# Patient Record
Sex: Female | Born: 1935 | Race: White | Hispanic: No | Smoking: Never smoker
Health system: Southern US, Community
[De-identification: ages and names within clinical notes are randomized; demographics above are authoritative.]

## PROBLEM LIST (undated history)

## (undated) DIAGNOSIS — M810 Age-related osteoporosis without current pathological fracture: Secondary | ICD-10-CM

## (undated) DIAGNOSIS — I10 Essential (primary) hypertension: Secondary | ICD-10-CM

## (undated) DIAGNOSIS — F419 Anxiety disorder, unspecified: Secondary | ICD-10-CM

## (undated) DIAGNOSIS — I350 Nonrheumatic aortic (valve) stenosis: Secondary | ICD-10-CM

## (undated) DIAGNOSIS — I493 Ventricular premature depolarization: Secondary | ICD-10-CM

## (undated) DIAGNOSIS — I2699 Other pulmonary embolism without acute cor pulmonale: Secondary | ICD-10-CM

## (undated) DIAGNOSIS — M1712 Unilateral primary osteoarthritis, left knee: Secondary | ICD-10-CM

## (undated) DIAGNOSIS — Z8619 Personal history of other infectious and parasitic diseases: Secondary | ICD-10-CM

## (undated) DIAGNOSIS — M353 Polymyalgia rheumatica: Secondary | ICD-10-CM

## (undated) DIAGNOSIS — M7580 Other shoulder lesions, unspecified shoulder: Secondary | ICD-10-CM

## (undated) DIAGNOSIS — I6529 Occlusion and stenosis of unspecified carotid artery: Secondary | ICD-10-CM

## (undated) DIAGNOSIS — E871 Hypo-osmolality and hyponatremia: Secondary | ICD-10-CM

## (undated) HISTORY — PX: OTHER SURGICAL HISTORY: SHX169

## (undated) HISTORY — DX: Anxiety disorder, unspecified: F41.9

## (undated) HISTORY — DX: Polymyalgia rheumatica: M35.3

## (undated) HISTORY — DX: Unilateral primary osteoarthritis, left knee: M17.12

## (undated) HISTORY — DX: Essential (primary) hypertension: I10

## (undated) HISTORY — DX: Age-related osteoporosis without current pathological fracture: M81.0

## (undated) HISTORY — DX: Nonrheumatic aortic (valve) stenosis: I35.0

## (undated) HISTORY — DX: Occlusion and stenosis of unspecified carotid artery: I65.29

## (undated) HISTORY — DX: Personal history of other infectious and parasitic diseases: Z86.19

## (undated) HISTORY — DX: Hypo-osmolality and hyponatremia: E87.1

## (undated) HISTORY — DX: Ventricular premature depolarization: I49.3

## (undated) HISTORY — DX: Other pulmonary embolism without acute cor pulmonale: I26.99

## (undated) HISTORY — DX: Other shoulder lesions, unspecified shoulder: M75.80

## (undated) HISTORY — PX: SKIN CANCER EXCISION: SHX779

---

## 1998-08-03 ENCOUNTER — Ambulatory Visit (HOSPITAL_COMMUNITY): Admission: RE | Admit: 1998-08-03 | Discharge: 1998-08-03 | Payer: Self-pay | Admitting: Rheumatology

## 2000-07-24 ENCOUNTER — Encounter: Admission: RE | Admit: 2000-07-24 | Discharge: 2000-07-24 | Payer: Self-pay | Admitting: Rheumatology

## 2000-07-24 ENCOUNTER — Encounter: Payer: Self-pay | Admitting: Rheumatology

## 2001-03-18 ENCOUNTER — Other Ambulatory Visit: Admission: RE | Admit: 2001-03-18 | Discharge: 2001-03-18 | Payer: Self-pay | Admitting: Rheumatology

## 2001-11-18 ENCOUNTER — Encounter: Payer: Self-pay | Admitting: Rheumatology

## 2001-11-18 ENCOUNTER — Encounter: Admission: RE | Admit: 2001-11-18 | Discharge: 2001-11-18 | Payer: Self-pay | Admitting: Rheumatology

## 2002-07-14 ENCOUNTER — Encounter: Payer: Self-pay | Admitting: Rheumatology

## 2002-07-14 ENCOUNTER — Encounter: Admission: RE | Admit: 2002-07-14 | Discharge: 2002-07-14 | Payer: Self-pay | Admitting: Rheumatology

## 2003-12-18 ENCOUNTER — Encounter: Admission: RE | Admit: 2003-12-18 | Discharge: 2003-12-18 | Payer: Self-pay | Admitting: Rheumatology

## 2004-03-21 ENCOUNTER — Encounter: Admission: RE | Admit: 2004-03-21 | Discharge: 2004-03-21 | Payer: Self-pay | Admitting: Rheumatology

## 2004-11-10 ENCOUNTER — Encounter: Admission: RE | Admit: 2004-11-10 | Discharge: 2004-11-10 | Payer: Self-pay | Admitting: Rheumatology

## 2004-11-24 ENCOUNTER — Encounter: Admission: RE | Admit: 2004-11-24 | Discharge: 2004-11-24 | Payer: Self-pay | Admitting: Rheumatology

## 2004-12-01 ENCOUNTER — Encounter: Admission: RE | Admit: 2004-12-01 | Discharge: 2004-12-01 | Payer: Self-pay | Admitting: Rheumatology

## 2005-02-20 DIAGNOSIS — I2699 Other pulmonary embolism without acute cor pulmonale: Secondary | ICD-10-CM

## 2005-02-20 HISTORY — DX: Other pulmonary embolism without acute cor pulmonale: I26.99

## 2005-11-16 ENCOUNTER — Inpatient Hospital Stay (HOSPITAL_COMMUNITY): Admission: EM | Admit: 2005-11-16 | Discharge: 2005-11-23 | Payer: Self-pay | Admitting: Emergency Medicine

## 2005-11-16 ENCOUNTER — Encounter: Admission: RE | Admit: 2005-11-16 | Discharge: 2005-11-16 | Payer: Self-pay | Admitting: Rheumatology

## 2009-05-21 DIAGNOSIS — E871 Hypo-osmolality and hyponatremia: Secondary | ICD-10-CM

## 2009-05-21 HISTORY — DX: Hypo-osmolality and hyponatremia: E87.1

## 2010-02-20 DIAGNOSIS — Z8619 Personal history of other infectious and parasitic diseases: Secondary | ICD-10-CM

## 2010-02-20 HISTORY — DX: Personal history of other infectious and parasitic diseases: Z86.19

## 2011-06-05 ENCOUNTER — Other Ambulatory Visit: Payer: Self-pay | Admitting: Geriatric Medicine

## 2011-06-05 DIAGNOSIS — R42 Dizziness and giddiness: Secondary | ICD-10-CM

## 2011-06-12 ENCOUNTER — Ambulatory Visit
Admission: RE | Admit: 2011-06-12 | Discharge: 2011-06-12 | Disposition: A | Discharge status: Home / Self Care | Payer: Medicare Other | Source: Ambulatory Visit | Attending: Geriatric Medicine | Admitting: Geriatric Medicine

## 2011-06-12 DIAGNOSIS — R42 Dizziness and giddiness: Secondary | ICD-10-CM

## 2014-02-19 DIAGNOSIS — R413 Other amnesia: Secondary | ICD-10-CM | POA: Insufficient documentation

## 2014-02-19 DIAGNOSIS — I10 Essential (primary) hypertension: Secondary | ICD-10-CM | POA: Insufficient documentation

## 2014-02-19 DIAGNOSIS — I359 Nonrheumatic aortic valve disorder, unspecified: Secondary | ICD-10-CM | POA: Insufficient documentation

## 2014-02-19 DIAGNOSIS — R5383 Other fatigue: Secondary | ICD-10-CM | POA: Insufficient documentation

## 2015-01-05 ENCOUNTER — Inpatient Hospital Stay (HOSPITAL_COMMUNITY)
Admission: EM | Admit: 2015-01-05 | Discharge: 2015-01-09 | DRG: 291 | Disposition: A | Discharge status: Home Health | Payer: Medicare Other | Attending: Internal Medicine | Admitting: Internal Medicine

## 2015-01-05 ENCOUNTER — Encounter (HOSPITAL_COMMUNITY): Payer: Self-pay | Admitting: Emergency Medicine

## 2015-01-05 ENCOUNTER — Emergency Department (HOSPITAL_COMMUNITY): Payer: Medicare Other

## 2015-01-05 DIAGNOSIS — I5023 Acute on chronic systolic (congestive) heart failure: Secondary | ICD-10-CM | POA: Diagnosis present

## 2015-01-05 DIAGNOSIS — R0602 Shortness of breath: Secondary | ICD-10-CM

## 2015-01-05 DIAGNOSIS — I272 Other secondary pulmonary hypertension: Secondary | ICD-10-CM | POA: Diagnosis present

## 2015-01-05 DIAGNOSIS — R748 Abnormal levels of other serum enzymes: Secondary | ICD-10-CM | POA: Diagnosis present

## 2015-01-05 DIAGNOSIS — R778 Other specified abnormalities of plasma proteins: Secondary | ICD-10-CM

## 2015-01-05 DIAGNOSIS — E876 Hypokalemia: Secondary | ICD-10-CM | POA: Diagnosis not present

## 2015-01-05 DIAGNOSIS — Z66 Do not resuscitate: Secondary | ICD-10-CM | POA: Diagnosis present

## 2015-01-05 DIAGNOSIS — R079 Chest pain, unspecified: Secondary | ICD-10-CM | POA: Diagnosis not present

## 2015-01-05 DIAGNOSIS — E440 Moderate protein-calorie malnutrition: Secondary | ICD-10-CM | POA: Diagnosis present

## 2015-01-05 DIAGNOSIS — Z7982 Long term (current) use of aspirin: Secondary | ICD-10-CM

## 2015-01-05 DIAGNOSIS — Z86711 Personal history of pulmonary embolism: Secondary | ICD-10-CM

## 2015-01-05 DIAGNOSIS — I359 Nonrheumatic aortic valve disorder, unspecified: Secondary | ICD-10-CM | POA: Diagnosis present

## 2015-01-05 DIAGNOSIS — R7989 Other specified abnormal findings of blood chemistry: Secondary | ICD-10-CM

## 2015-01-05 DIAGNOSIS — I110 Hypertensive heart disease with heart failure: Principal | ICD-10-CM | POA: Diagnosis present

## 2015-01-05 DIAGNOSIS — I10 Essential (primary) hypertension: Secondary | ICD-10-CM | POA: Diagnosis present

## 2015-01-05 DIAGNOSIS — I350 Nonrheumatic aortic (valve) stenosis: Secondary | ICD-10-CM | POA: Diagnosis present

## 2015-01-05 DIAGNOSIS — M1712 Unilateral primary osteoarthritis, left knee: Secondary | ICD-10-CM | POA: Diagnosis present

## 2015-01-05 DIAGNOSIS — J9601 Acute respiratory failure with hypoxia: Secondary | ICD-10-CM | POA: Diagnosis present

## 2015-01-05 DIAGNOSIS — I509 Heart failure, unspecified: Secondary | ICD-10-CM | POA: Diagnosis present

## 2015-01-05 DIAGNOSIS — F419 Anxiety disorder, unspecified: Secondary | ICD-10-CM | POA: Diagnosis present

## 2015-01-05 DIAGNOSIS — M810 Age-related osteoporosis without current pathological fracture: Secondary | ICD-10-CM | POA: Diagnosis present

## 2015-01-05 DIAGNOSIS — I2489 Other forms of acute ischemic heart disease: Secondary | ICD-10-CM | POA: Diagnosis present

## 2015-01-05 DIAGNOSIS — R799 Abnormal finding of blood chemistry, unspecified: Secondary | ICD-10-CM

## 2015-01-05 DIAGNOSIS — D539 Nutritional anemia, unspecified: Secondary | ICD-10-CM | POA: Insufficient documentation

## 2015-01-05 DIAGNOSIS — M353 Polymyalgia rheumatica: Secondary | ICD-10-CM | POA: Diagnosis present

## 2015-01-05 DIAGNOSIS — I248 Other forms of acute ischemic heart disease: Secondary | ICD-10-CM | POA: Diagnosis present

## 2015-01-05 DIAGNOSIS — Z681 Body mass index (BMI) 19 or less, adult: Secondary | ICD-10-CM

## 2015-01-05 LAB — BASIC METABOLIC PANEL
ANION GAP: 7 (ref 5–15)
BUN: 31 mg/dL — ABNORMAL HIGH (ref 6–20)
CALCIUM: 9.7 mg/dL (ref 8.9–10.3)
CHLORIDE: 105 mmol/L (ref 101–111)
CO2: 29 mmol/L (ref 22–32)
Creatinine, Ser: 0.94 mg/dL (ref 0.44–1.00)
GFR calc non Af Amer: 52 mL/min — ABNORMAL LOW (ref >60)
GLUCOSE: 146 mg/dL — AB (ref 65–99)
POTASSIUM: 4.6 mmol/L (ref 3.5–5.1)
Sodium: 141 mmol/L (ref 135–145)

## 2015-01-05 LAB — CBC WITH DIFFERENTIAL/PLATELET
BASOS PCT: 0 %
Basophils Absolute: 0 10*3/uL (ref 0.0–0.1)
Eosinophils Absolute: 0.1 10*3/uL (ref 0.0–0.7)
Eosinophils Relative: 2 %
HEMATOCRIT: 33.1 % — AB (ref 36.0–46.0)
HEMOGLOBIN: 10.3 g/dL — AB (ref 12.0–15.0)
LYMPHS PCT: 31 %
Lymphs Abs: 2.3 10*3/uL (ref 0.7–4.0)
MCH: 32.2 pg (ref 26.0–34.0)
MCHC: 31.1 g/dL (ref 30.0–36.0)
MCV: 103.4 fL — AB (ref 78.0–100.0)
MONOS PCT: 7 %
Monocytes Absolute: 0.5 10*3/uL (ref 0.1–1.0)
NEUTROS ABS: 4.4 10*3/uL (ref 1.7–7.7)
NEUTROS PCT: 60 %
Platelets: 144 10*3/uL — ABNORMAL LOW (ref 150–400)
RBC: 3.2 MIL/uL — ABNORMAL LOW (ref 3.87–5.11)
RDW: 15.3 % (ref 11.5–15.5)
WBC: 7.4 10*3/uL (ref 4.0–10.5)

## 2015-01-05 MED ORDER — FUROSEMIDE 10 MG/ML IJ SOLN
40.0000 mg | Freq: Once | INTRAMUSCULAR | Status: AC
  Administered 2015-01-06: 40 mg via INTRAVENOUS
  Filled 2015-01-05: qty 4

## 2015-01-05 NOTE — ED Notes (Signed)
Glick, MD at bedside.  

## 2015-01-05 NOTE — ED Provider Notes (Signed)
CSN: [id number]   Arrival date & time 01/05/15 2250  History  By signing my name below, I, Bethel Born, attest that this documentation has been prepared under the direction and in the presence of Dione Booze, MD. Electronically Signed: Bethel Born, ED Scribe. 01/05/2015. 1:10 AM.  Chief Complaint  Patient presents with  . Shortness of Breath    HPI The history is provided by the patient and a relative. No language interpreter was used.   Natalie Arnold is a 81 y.o. female with history of PE in 2007, HTN, and aortic stenosis who presents to the Emergency Department complaining of constant and worsening SOB with gradual onset 2 days ago. Associated symptoms include dry cough, rib pain, and leg swelling. Pt denies orthopnea, sweating, and nausea. She ambulates with the assistance of a cane and states that she does not walk much. Her primary care is with Dr. Pete Glatter at [facility] and a relative at the bedside states that she had an unremarkable physical and lab work last month.  Past Medical History  Diagnosis Date  . Pulmonary embolus (HCC) 2007    status post Coumadin  . Hypertension   . Polymyalgia rheumatica (HCC)   . Osteoporosis   . Osteoarthritis of left knee   . Carotid artery occlusion     carotid bruits no ICA stenosis  . Anxiety   . PVC (premature ventricular contraction)   . Rotator cuff tendinitis   . Hyponatremia 05/2009    Mild  . Aortic stenosis, mild last echo 08/2009    pt declines further echoes 08/2010  . History of shingles 2012    Past Surgical History  Procedure Laterality Date  . Abdonimal hysterectomy    . Skin cancer excision      Family History  Problem Relation Age of Onset  . Emphysema Brother     Social History  Substance Use Topics  . Smoking status: Never Smoker   . Smokeless tobacco: None  . Alcohol Use: No     Review of Systems  Constitutional: Negative for fever and diaphoresis.  Respiratory: Positive for cough and shortness of  breath.   Cardiovascular: Positive for leg swelling.  Gastrointestinal: Negative for nausea.  Musculoskeletal:       Rib pain  All other systems reviewed and are negative.  Home Medications   Prior to Admission medications   Medication Sig Start Date End Date Taking? Authorizing Provider  amLODipine (NORVASC) 2.5 MG tablet Take 2.5 mg by mouth daily.   Yes Historical Provider, MD  Calcium Carbonate-Vitamin D (CALCIUM 600+D) 600-400 MG-UNIT per tablet Take 2 tablets by mouth daily.    Yes Historical Provider, MD  cholecalciferol (VITAMIN D) 1000 UNITS tablet Take 2,000 Units by mouth daily.    Yes Historical Provider, MD  HYDROcodone-acetaminophen (NORCO/VICODIN) 5-325 MG per tablet Take 1 tablet by mouth every 6 (six) hours as needed for moderate pain.   Yes Historical Provider, MD  LORazepam (ATIVAN) 1 MG tablet Take 0.5 mg by mouth 2 (two) times daily as needed for anxiety.    Yes Historical Provider, MD  magnesium hydroxide (MILK OF MAGNESIA) 400 MG/5ML suspension Take 30 mLs by mouth daily as needed for mild constipation.   Yes Historical Provider, MD  meclizine (ANTIVERT) 12.5 MG tablet Take 12.5 mg by mouth as needed for dizziness.   Yes Historical Provider, MD  Multiple Vitamin (MULTIVITAMIN) tablet Take 1 tablet by mouth daily.   Yes Historical Provider, MD  sennosides-docusate sodium (SENOKOT-S) 8.6-50 MG  tablet Take 1 tablet by mouth daily as needed for constipation.   Yes Historical Provider, MD    Allergies  Ultracet and Penicillins  Triage Vitals: BP 164/84 mmHg  Pulse 84  Temp(Src) 97.6 F (36.4 C) (Axillary)  Resp 26  SpO2 96%  Physical Exam  Constitutional: She is oriented to person, place, and time. She appears well-developed and well-nourished. No distress.  HENT:  Head: Normocephalic and atraumatic.  Eyes: EOM are normal. Pupils are equal, round, and reactive to light.  Neck: Normal range of motion. Neck supple. No JVD present.  Cardiovascular: Normal rate and  regular rhythm.   Murmur heard. 3/6 harsh Systolic ejection murmur at the base with radiation to the apex and to the neck consistent with aortic stenosis  Pulmonary/Chest: Effort normal. She has decreased breath sounds. She has rales.  Decreased breath sounds at the bases bilaterally. Bibasilar rales.  Abdominal: Soft. Bowel sounds are normal. She exhibits no distension and no mass. There is no tenderness.  Musculoskeletal: Normal range of motion.  1+ pitting edema bilaterally  Lymphadenopathy:    She has no cervical adenopathy.  Neurological: She is alert and oriented to person, place, and time. No cranial nerve deficit. She exhibits normal muscle tone. Coordination normal.  Skin: Skin is warm and dry. No rash noted.  Psychiatric: She has a normal mood and affect. Her behavior is normal. Judgment and thought content normal.  Nursing note and vitals reviewed.   ED Course  Procedures   DIAGNOSTIC STUDIES: Oxygen Saturation is 96% on RA, normal by my interpretation.    COORDINATION OF CARE: 11:44 PM Discussed treatment plan which includes lab work, CXR, EKG, and Lasix with pt at bedside and pt agreed to plan.  1:08 AM-Consult complete with Dr. Clyde LundborgNiu (Hospitalist). Patient case explained and discussed. Agrees to admit patient for further evaluation and treatment. Call ended at 1:09 AM   Results for orders placed or performed during the hospital encounter of 01/05/15  Basic metabolic panel  Result Value Ref Range   Sodium 141 135 - 145 mmol/L   Potassium 4.6 3.5 - 5.1 mmol/L   Chloride 105 101 - 111 mmol/L   CO2 29 22 - 32 mmol/L   Glucose, Bld 146 (H) 65 - 99 mg/dL   BUN 31 (H) 6 - 20 mg/dL   Creatinine, Ser 2.950.94 0.44 - 1.00 mg/dL   Calcium 9.7 8.9 - 62.110.3 mg/dL   GFR calc non Af Amer 52 (L) >60 mL/min   GFR calc Af Amer >60 >60 mL/min   Anion gap 7 5 - 15  CBC with Differential  Result Value Ref Range   WBC 7.4 4.0 - 10.5 K/uL   RBC 3.20 (L) 3.87 - 5.11 MIL/uL   Hemoglobin  10.3 (L) 12.0 - 15.0 g/dL   HCT 30.833.1 (L) 65.736.0 - 84.646.0 %   MCV 103.4 (H) 78.0 - 100.0 fL   MCH 32.2 26.0 - 34.0 pg   MCHC 31.1 30.0 - 36.0 g/dL   RDW 96.215.3 95.211.5 - 84.115.5 %   Platelets 144 (L) 150 - 400 K/uL   Neutrophils Relative % 60 %   Neutro Abs 4.4 1.7 - 7.7 K/uL   Lymphocytes Relative 31 %   Lymphs Abs 2.3 0.7 - 4.0 K/uL   Monocytes Relative 7 %   Monocytes Absolute 0.5 0.1 - 1.0 K/uL   Eosinophils Relative 2 %   Eosinophils Absolute 0.1 0.0 - 0.7 K/uL   Basophils Relative 0 %   Basophils  Absolute 0.0 0.0 - 0.1 K/uL  Brain natriuretic peptide  Result Value Ref Range   B Natriuretic Peptide 2380.1 (H) 0.0 - 100.0 pg/mL  Troponin I  Result Value Ref Range   Troponin I 0.21 (H) <0.031 ng/mL   Imaging Review Dg Chest 2 View  01/05/2015  CLINICAL DATA:  Pt states severe rt anteriot chest/rib pains x 2 weeks +,hx past PE, htn, non smoker, she states no known injury or fall EXAM: CHEST - 2 VIEW COMPARISON:  05/06/2012 FINDINGS: Interval development of moderate interstitial edema or infiltrates. Interval development of moderate bilateral pleural effusions, with adjacent consolidation/ atelectasis in both lung bases. Remote L2 kyphoplasty/vertebroplasty. Compression deformities of T11 and T12 as before. Atheromatous aorta. Mild cardiomegaly suspected, difficult to accurately assess due to adjacent opacities. IMPRESSION: 1. New bilateral interstitial edema, moderate effusions, and bibasilar atelectasis. Electronically Signed   By: Corlis Leak M.D.   On: 01/05/2015 23:32    I personally reviewed and evaluated these images and lab results as a part of my medical decision-making.   EKG Interpretation  Date/Time:  Tuesday January 05 2015 22:57:40 EST Ventricular Rate:  88 PR Interval:  190 QRS Duration: 76 QT Interval:  374 QTC Calculation: 452 R Axis:   21 Text Interpretation:  Normal sinus rhythm Anteroseptal infarct , age undetermined Abnormal ECG No old tracing to compare Confirmed by  [facility]  MD, Henley Blyth ([id number]) on 01/05/2015 11:42:07 PM    MDM   Final diagnoses:  Acute on chronic congestive heart failure, unspecified congestive heart failure type (HCC)  Elevated troponin I level  Elevated BUN  Macrocytic anemia    Dyspnea with peripheral edema suggestive of congestive heart failure. Chest x-ray shows bilateral pleural effusions which I'll also be consistent with congestive heart failure. Patient's exam is suggestive of aortic stenosis. Family member with her states that she had been offered procedure related to her murmur and had declined. Review of old records shows a hospitalization about 10 years ago for pulmonary embolism but she is no longer on anticoagulation. She is given a dose of furosemide. Laboratory workup shows a macrocytic anemia and significantly elevated BNP. She also has mild elevation of troponin. Unclear at this point whether this is demand ischemia or underlying cardiac injury. Mild elevation of the BUN is consistent with congestive heart failure. Of note, with aortic stenosis, development of angina or congestive heart failure would indicate a poor short-term (1-2 years) prognosis. Case is discussed with Dr. Clyde Lundborg [facility] who agrees to admit the patient.  I personally performed the services described in this documentation, which was scribed in my presence. The recorded information has been reviewed and is accurate.      Dione Booze, MD 01/06/15 0111

## 2015-01-05 NOTE — ED Notes (Signed)
Pt. reports worsening SOB with dry cough for several days , denies fever or chills , no chest pain .

## 2015-01-06 ENCOUNTER — Encounter (HOSPITAL_COMMUNITY): Payer: Self-pay

## 2015-01-06 ENCOUNTER — Inpatient Hospital Stay (HOSPITAL_COMMUNITY): Payer: Medicare Other

## 2015-01-06 ENCOUNTER — Other Ambulatory Visit (HOSPITAL_COMMUNITY): Payer: Medicare Other

## 2015-01-06 DIAGNOSIS — J9601 Acute respiratory failure with hypoxia: Secondary | ICD-10-CM | POA: Diagnosis present

## 2015-01-06 DIAGNOSIS — I509 Heart failure, unspecified: Secondary | ICD-10-CM

## 2015-01-06 DIAGNOSIS — I248 Other forms of acute ischemic heart disease: Secondary | ICD-10-CM | POA: Diagnosis present

## 2015-01-06 DIAGNOSIS — E440 Moderate protein-calorie malnutrition: Secondary | ICD-10-CM | POA: Diagnosis present

## 2015-01-06 DIAGNOSIS — F419 Anxiety disorder, unspecified: Secondary | ICD-10-CM | POA: Diagnosis present

## 2015-01-06 DIAGNOSIS — E876 Hypokalemia: Secondary | ICD-10-CM | POA: Diagnosis present

## 2015-01-06 DIAGNOSIS — R079 Chest pain, unspecified: Secondary | ICD-10-CM | POA: Diagnosis present

## 2015-01-06 DIAGNOSIS — Z7982 Long term (current) use of aspirin: Secondary | ICD-10-CM | POA: Diagnosis not present

## 2015-01-06 DIAGNOSIS — I350 Nonrheumatic aortic (valve) stenosis: Secondary | ICD-10-CM | POA: Diagnosis not present

## 2015-01-06 DIAGNOSIS — I359 Nonrheumatic aortic valve disorder, unspecified: Secondary | ICD-10-CM | POA: Diagnosis not present

## 2015-01-06 DIAGNOSIS — I110 Hypertensive heart disease with heart failure: Secondary | ICD-10-CM | POA: Diagnosis present

## 2015-01-06 DIAGNOSIS — M810 Age-related osteoporosis without current pathological fracture: Secondary | ICD-10-CM | POA: Diagnosis present

## 2015-01-06 DIAGNOSIS — I5031 Acute diastolic (congestive) heart failure: Secondary | ICD-10-CM | POA: Diagnosis not present

## 2015-01-06 DIAGNOSIS — R7989 Other specified abnormal findings of blood chemistry: Secondary | ICD-10-CM | POA: Diagnosis not present

## 2015-01-06 DIAGNOSIS — I272 Other secondary pulmonary hypertension: Secondary | ICD-10-CM | POA: Diagnosis present

## 2015-01-06 DIAGNOSIS — R748 Abnormal levels of other serum enzymes: Secondary | ICD-10-CM | POA: Diagnosis present

## 2015-01-06 DIAGNOSIS — Z86711 Personal history of pulmonary embolism: Secondary | ICD-10-CM | POA: Diagnosis not present

## 2015-01-06 DIAGNOSIS — I5023 Acute on chronic systolic (congestive) heart failure: Secondary | ICD-10-CM | POA: Diagnosis present

## 2015-01-06 DIAGNOSIS — Z681 Body mass index (BMI) 19 or less, adult: Secondary | ICD-10-CM | POA: Diagnosis not present

## 2015-01-06 DIAGNOSIS — M353 Polymyalgia rheumatica: Secondary | ICD-10-CM | POA: Diagnosis present

## 2015-01-06 DIAGNOSIS — I5033 Acute on chronic diastolic (congestive) heart failure: Secondary | ICD-10-CM | POA: Diagnosis not present

## 2015-01-06 DIAGNOSIS — M1712 Unilateral primary osteoarthritis, left knee: Secondary | ICD-10-CM | POA: Diagnosis present

## 2015-01-06 DIAGNOSIS — M179 Osteoarthritis of knee, unspecified: Secondary | ICD-10-CM

## 2015-01-06 DIAGNOSIS — Z66 Do not resuscitate: Secondary | ICD-10-CM | POA: Diagnosis present

## 2015-01-06 LAB — HEPARIN LEVEL (UNFRACTIONATED)
Heparin Unfractionated: 0.35 IU/mL (ref 0.30–0.70)
Heparin Unfractionated: 0.24 IU/mL — ABNORMAL LOW (ref 0.30–0.70)

## 2015-01-06 LAB — APTT: APTT: 28 s (ref 24–37)

## 2015-01-06 LAB — CBC
HCT: 32.7 % — ABNORMAL LOW (ref 36.0–46.0)
HEMOGLOBIN: 10.2 g/dL — AB (ref 12.0–15.0)
MCH: 32 pg (ref 26.0–34.0)
MCHC: 31.2 g/dL (ref 30.0–36.0)
MCV: 102.5 fL — ABNORMAL HIGH (ref 78.0–100.0)
Platelets: 133 10*3/uL — ABNORMAL LOW (ref 150–400)
RBC: 3.19 MIL/uL — ABNORMAL LOW (ref 3.87–5.11)
RDW: 15.1 % (ref 11.5–15.5)
WBC: 7.2 10*3/uL (ref 4.0–10.5)

## 2015-01-06 LAB — TROPONIN I
TROPONIN I: 0.21 ng/mL — AB (ref <0.031)
Troponin I: 0.21 ng/mL — ABNORMAL HIGH (ref <0.031)
Troponin I: 0.19 ng/mL — ABNORMAL HIGH (ref <0.031)
Troponin I: 0.19 ng/mL — ABNORMAL HIGH (ref <0.031)

## 2015-01-06 LAB — LIPID PANEL
CHOLESTEROL: 125 mg/dL (ref 0–200)
HDL: 43 mg/dL (ref >40)
LDL CALC: 62 mg/dL (ref 0–99)
Total CHOL/HDL Ratio: 2.9 RATIO
Triglycerides: 102 mg/dL (ref <150)
VLDL: 20 mg/dL (ref 0–40)

## 2015-01-06 LAB — COMPREHENSIVE METABOLIC PANEL
ALBUMIN: 4 g/dL (ref 3.5–5.0)
ALK PHOS: 65 U/L (ref 38–126)
ALT: 37 U/L (ref 14–54)
ANION GAP: 10 (ref 5–15)
AST: 58 U/L — ABNORMAL HIGH (ref 15–41)
BILIRUBIN TOTAL: 0.8 mg/dL (ref 0.3–1.2)
BUN: 31 mg/dL — ABNORMAL HIGH (ref 6–20)
CALCIUM: 9.9 mg/dL (ref 8.9–10.3)
CO2: 28 mmol/L (ref 22–32)
Chloride: 103 mmol/L (ref 101–111)
Creatinine, Ser: 1.01 mg/dL — ABNORMAL HIGH (ref 0.44–1.00)
GFR, EST AFRICAN AMERICAN: 55 mL/min — AB (ref >60)
GFR, EST NON AFRICAN AMERICAN: 48 mL/min — AB (ref >60)
GLUCOSE: 119 mg/dL — AB (ref 65–99)
Potassium: 4.4 mmol/L (ref 3.5–5.1)
Sodium: 141 mmol/L (ref 135–145)
TOTAL PROTEIN: 7.3 g/dL (ref 6.5–8.1)

## 2015-01-06 LAB — PROTIME-INR
INR: 1.17 (ref 0.00–1.49)
PROTHROMBIN TIME: 15 s (ref 11.6–15.2)

## 2015-01-06 LAB — TSH: TSH: 7.298 u[IU]/mL — AB (ref 0.350–4.500)

## 2015-01-06 LAB — GLUCOSE, CAPILLARY: GLUCOSE-CAPILLARY: 102 mg/dL — AB (ref 65–99)

## 2015-01-06 LAB — MRSA PCR SCREENING: MRSA by PCR: NEGATIVE

## 2015-01-06 LAB — BRAIN NATRIURETIC PEPTIDE: B NATRIURETIC PEPTIDE 5: 2380.1 pg/mL — AB (ref 0.0–100.0)

## 2015-01-06 MED ORDER — ALBUTEROL SULFATE (2.5 MG/3ML) 0.083% IN NEBU
2.5000 mg | INHALATION_SOLUTION | RESPIRATORY_TRACT | Status: DC | PRN
  Administered 2015-01-06: 2.5 mg via RESPIRATORY_TRACT

## 2015-01-06 MED ORDER — ASPIRIN EC 325 MG PO TBEC: 325.0000 mg | DELAYED_RELEASE_TABLET | Freq: Every day | ORAL | Status: DC

## 2015-01-06 MED ORDER — ONDANSETRON HCL 4 MG PO TABS: 4.0000 mg | ORAL_TABLET | Freq: Four times a day (QID) | ORAL | Status: DC | PRN

## 2015-01-06 MED ORDER — MECLIZINE HCL 12.5 MG PO TABS: 12.5000 mg | ORAL_TABLET | ORAL | Status: DC | PRN

## 2015-01-06 MED ORDER — ASPIRIN 81 MG PO CHEW: 324.0000 mg | CHEWABLE_TABLET | Freq: Once | ORAL | Status: DC

## 2015-01-06 MED ORDER — SENNOSIDES-DOCUSATE SODIUM 8.6-50 MG PO TABS: 1.0000 | ORAL_TABLET | Freq: Every day | ORAL | Status: DC | PRN

## 2015-01-06 MED ORDER — AMLODIPINE BESYLATE 2.5 MG PO TABS
2.5000 mg | ORAL_TABLET | Freq: Every day | ORAL | Status: DC
  Administered 2015-01-06 – 2015-01-09 (×4): 2.5 mg via ORAL
  Filled 2015-01-06 (×4): qty 1

## 2015-01-06 MED ORDER — ASPIRIN 81 MG PO CHEW
81.0000 mg | CHEWABLE_TABLET | Freq: Every day | ORAL | Status: DC
  Administered 2015-01-06 – 2015-01-09 (×4): 81 mg via ORAL
  Filled 2015-01-06 (×4): qty 1

## 2015-01-06 MED ORDER — FUROSEMIDE 10 MG/ML IJ SOLN: 40.0000 mg | Freq: Four times a day (QID) | INTRAMUSCULAR | Status: DC

## 2015-01-06 MED ORDER — MECLIZINE HCL 25 MG PO TABS: 12.5000 mg | ORAL_TABLET | Freq: Three times a day (TID) | ORAL | Status: DC | PRN

## 2015-01-06 MED ORDER — FUROSEMIDE 10 MG/ML IJ SOLN
40.0000 mg | Freq: Four times a day (QID) | INTRAMUSCULAR | Status: DC
  Administered 2015-01-06 – 2015-01-08 (×7): 40 mg via INTRAVENOUS
  Filled 2015-01-06 (×7): qty 4

## 2015-01-06 MED ORDER — SODIUM CHLORIDE 0.9 % IV SOLN
INTRAVENOUS | Status: DC
  Administered 2015-01-06: 03:00:00 via INTRAVENOUS

## 2015-01-06 MED ORDER — LISINOPRIL 2.5 MG PO TABS
2.5000 mg | ORAL_TABLET | Freq: Every day | ORAL | Status: DC
Start: 2015-01-06 — End: 2015-01-06

## 2015-01-06 MED ORDER — ONDANSETRON HCL 4 MG/2ML IJ SOLN: 4.0000 mg | Freq: Four times a day (QID) | INTRAMUSCULAR | Status: DC | PRN

## 2015-01-06 MED ORDER — HEPARIN (PORCINE) IN NACL 100-0.45 UNIT/ML-% IJ SOLN
700.0000 [IU]/h | INTRAMUSCULAR | Status: DC
  Administered 2015-01-06: 600 [IU]/h via INTRAVENOUS
  Administered 2015-01-07: 700 [IU]/h via INTRAVENOUS
  Filled 2015-01-06 (×2): qty 250

## 2015-01-06 MED ORDER — CETYLPYRIDINIUM CHLORIDE 0.05 % MT LIQD
7.0000 mL | Freq: Two times a day (BID) | OROMUCOSAL | Status: DC
  Administered 2015-01-06 – 2015-01-09 (×7): 7 mL via OROMUCOSAL

## 2015-01-06 MED ORDER — VITAMIN D 1000 UNITS PO TABS
2000.0000 [IU] | ORAL_TABLET | Freq: Every day | ORAL | Status: DC
  Administered 2015-01-06 – 2015-01-09 (×4): 2000 [IU] via ORAL
  Filled 2015-01-06 (×4): qty 2

## 2015-01-06 MED ORDER — MAGNESIUM HYDROXIDE 400 MG/5ML PO SUSP: 30.0000 mL | Freq: Every day | ORAL | Status: DC | PRN

## 2015-01-06 MED ORDER — CALCIUM CARBONATE-VITAMIN D 500-200 MG-UNIT PO TABS
2.0000 | ORAL_TABLET | Freq: Every day | ORAL | Status: DC
  Administered 2015-01-06 – 2015-01-09 (×4): 2 via ORAL
  Filled 2015-01-06 (×3): qty 2

## 2015-01-06 MED ORDER — OXYCODONE HCL 5 MG PO TABS: 5.0000 mg | ORAL_TABLET | ORAL | Status: DC | PRN

## 2015-01-06 MED ORDER — LISINOPRIL 5 MG PO TABS: 5.0000 mg | ORAL_TABLET | Freq: Every day | ORAL | Status: DC

## 2015-01-06 MED ORDER — ENSURE ENLIVE PO LIQD
237.0000 mL | Freq: Two times a day (BID) | ORAL | Status: DC
  Administered 2015-01-06 – 2015-01-09 (×5): 237 mL via ORAL

## 2015-01-06 MED ORDER — FUROSEMIDE 10 MG/ML IJ SOLN
40.0000 mg | Freq: Four times a day (QID) | INTRAMUSCULAR | Status: DC
  Administered 2015-01-06: 40 mg via INTRAVENOUS

## 2015-01-06 MED ORDER — ATORVASTATIN CALCIUM 80 MG PO TABS
80.0000 mg | ORAL_TABLET | Freq: Every day | ORAL | Status: DC
  Administered 2015-01-06 – 2015-01-08 (×3): 80 mg via ORAL
  Filled 2015-01-06 (×3): qty 1

## 2015-01-06 MED ORDER — IOHEXOL 350 MG/ML SOLN
80.0000 mL | Freq: Once | INTRAVENOUS | Status: AC | PRN
  Administered 2015-01-06: 80 mL via INTRAVENOUS

## 2015-01-06 MED ORDER — MORPHINE SULFATE (PF) 2 MG/ML IV SOLN: 2.0000 mg | INTRAVENOUS | Status: DC | PRN

## 2015-01-06 MED ORDER — LORAZEPAM 0.5 MG PO TABS
0.5000 mg | ORAL_TABLET | Freq: Two times a day (BID) | ORAL | Status: DC | PRN
  Administered 2015-01-08: 0.5 mg via ORAL
  Filled 2015-01-06: qty 1

## 2015-01-06 MED ORDER — ATORVASTATIN CALCIUM 40 MG PO TABS: 40.0000 mg | ORAL_TABLET | Freq: Every day | ORAL | Status: DC

## 2015-01-06 MED ORDER — SODIUM CHLORIDE 0.9 % IJ SOLN
3.0000 mL | Freq: Two times a day (BID) | INTRAMUSCULAR | Status: DC
  Administered 2015-01-06 – 2015-01-09 (×7): 3 mL via INTRAVENOUS

## 2015-01-06 MED ORDER — HYDROCODONE-ACETAMINOPHEN 5-325 MG PO TABS: 1.0000 | ORAL_TABLET | Freq: Four times a day (QID) | ORAL | Status: DC | PRN

## 2015-01-06 MED ORDER — NITROGLYCERIN 0.4 MG SL SUBL: 0.4000 mg | SUBLINGUAL_TABLET | SUBLINGUAL | Status: DC | PRN

## 2015-01-06 MED ORDER — HEPARIN BOLUS VIA INFUSION
3000.0000 [IU] | Freq: Once | INTRAVENOUS | Status: AC
  Administered 2015-01-06: 3000 [IU] via INTRAVENOUS
  Filled 2015-01-06: qty 3000

## 2015-01-06 MED ORDER — LISINOPRIL 5 MG PO TABS
5.0000 mg | ORAL_TABLET | Freq: Every day | ORAL | Status: DC
  Administered 2015-01-06 – 2015-01-09 (×4): 5 mg via ORAL
  Filled 2015-01-06 (×4): qty 1

## 2015-01-06 MED ORDER — ADULT MULTIVITAMIN W/MINERALS CH
1.0000 | ORAL_TABLET | Freq: Every day | ORAL | Status: DC
  Administered 2015-01-06 – 2015-01-09 (×4): 1 via ORAL
  Filled 2015-01-06 (×4): qty 1

## 2015-01-06 NOTE — H&P (Addendum)
[facility] History and Physical  Natalie Arnold:[id number] DOB: 06/19/1923 DOA: 01/05/2015  Referring physician: ED physician PCP: Ginette Otto, MD  Specialists:   Chief Complaint: Shortness of breath and chest pain  HPI: Natalie Arnold is a 81 y.o. female with PMH of PE 2007, hypertension, anxiety, PMR, PVC, aortic stenosis, OP, OA of left knee, who presents with shortness of breath and chest pain.  Patient reports that she has been having shortness of breath and chest pain for 3 days. She has dry cough, but no fever or chills. Her chest pain is located in the right chest. It is constant, sharp, moderate and pleuritic. It is aggravated by deep breath and coughing. Patient does not have tenderness over calf areas. Patient does not have abdominal pain, diarrhea, symptoms of UTI, unilateral weakness.  In ED, patient was found to have elevated troponin 0.21, BNP 2380, temperature normal, oxygen saturation to 88% on room air, tachypnea, heart rates in 90s, electrolytes okay, renal function okay. Chest is showed new bilateral interstitial edema, moderate effusions, and bibasilar atelectasis. Patient is admitted to inpatient for further evaluation and treatment. Cardiology was consulted.  Where does patient live?   At home    Can patient participate in ADLs?  Little  Review of Systems:   General: no fevers, chills, no changes in body weight, has fatigue HEENT: no blurry vision, hearing changes or sore throat Pulm: has dyspnea, coughing, no wheezing CV: has chest pain, no palpitations Abd: no nausea, vomiting, abdominal pain, diarrhea, constipation GU: no dysuria, burning on urination, increased urinary frequency, hematuria  Ext: no leg edema Neuro: no unilateral weakness, numbness, or tingling, no vision change or hearing loss Skin: no rash MSK: No muscle spasm, no deformity, no limitation of range of movement in spin Heme: No easy bruising.  Travel history: No recent long  distant travel.  Allergy:  Allergies  Allergen Reactions  . Ultracet [Tramadol-Acetaminophen] Other (See Comments)    confusion  . Penicillins Rash    Past Medical History  Diagnosis Date  . Pulmonary embolus (HCC) 2007    status post Coumadin  . Hypertension   . Polymyalgia rheumatica (HCC)   . Osteoporosis   . Osteoarthritis of left knee   . Carotid artery occlusion     carotid bruits no ICA stenosis  . Anxiety   . PVC (premature ventricular contraction)   . Rotator cuff tendinitis   . Hyponatremia 05/2009    Mild  . Aortic stenosis, mild last echo 08/2009    pt declines further echoes 08/2010  . History of shingles 2012    Past Surgical History  Procedure Laterality Date  . Abdonimal hysterectomy    . Skin cancer excision      Social History:  reports that she has never smoked. She does not have any smokeless tobacco history on file. She reports that she does not drink alcohol. Her drug history is not on file.  Family History:  Family History  Problem Relation Age of Onset  . Emphysema Brother      Prior to Admission medications   Medication Sig Start Date End Date Taking? Authorizing Provider  amLODipine (NORVASC) 2.5 MG tablet Take 2.5 mg by mouth daily.   Yes Historical Provider, MD  Calcium Carbonate-Vitamin D (CALCIUM 600+D) 600-400 MG-UNIT per tablet Take 2 tablets by mouth daily.    Yes Historical Provider, MD  cholecalciferol (VITAMIN D) 1000 UNITS tablet Take 2,000 Units by mouth daily.    Yes Historical  Provider, MD  HYDROcodone-acetaminophen (NORCO/VICODIN) 5-325 MG per tablet Take 1 tablet by mouth every 6 (six) hours as needed for moderate pain.   Yes Historical Provider, MD  LORazepam (ATIVAN) 1 MG tablet Take 0.5 mg by mouth 2 (two) times daily as needed for anxiety.    Yes Historical Provider, MD  magnesium hydroxide (MILK OF MAGNESIA) 400 MG/5ML suspension Take 30 mLs by mouth daily as needed for mild constipation.   Yes Historical Provider, MD   meclizine (ANTIVERT) 12.5 MG tablet Take 12.5 mg by mouth as needed for dizziness.   Yes Historical Provider, MD  Multiple Vitamin (MULTIVITAMIN) tablet Take 1 tablet by mouth daily.   Yes Historical Provider, MD  sennosides-docusate sodium (SENOKOT-S) 8.6-50 MG tablet Take 1 tablet by mouth daily as needed for constipation.   Yes Historical Provider, MD    Physical Exam: Filed Vitals:   01/06/15 0015 01/06/15 0030 01/06/15 0045 01/06/15 0119  BP: 144/87 136/79 154/84 141/107  Pulse: 83 81 81 89  Temp:      TempSrc:      Resp: Height:     (1.626 m)  Weight:    50.803 kg (112 lb)  SpO2: 93% 96% 95% 96%   General: Not in acute distress HEENT:       Eyes: PERRL, EOMI, no scleral icterus.       ENT: No discharge from the ears and nose, no pharynx injection, no tonsillar enlargement.        Neck: Positive JVD, no bruit, no mass felt. Heme: No neck lymph node enlargement. Cardiac: S1/S2, RRR, 3/6 systolic murmurs, No gallops or rubs. Pulm: has rales over bilateral lower fields. No wheezing, rhonchi or rubs. Abd: Soft, nondistended, nontender, no rebound pain, no organomegaly, BS present. Ext: 1+ pitting leg edema bilaterally. 2+DP/PT pulse bilaterally. Musculoskeletal: No joint deformities, No joint redness or warmth, no limitation of ROM in spin. Skin: No rashes.  Neuro: Alert, oriented X3, cranial nerves II-XII grossly intact, muscle strength 5/5 in all extremities. Psych: Patient is not psychotic, no suicidal or hemocidal ideation.  Labs on Admission:  Basic Metabolic Panel:  Recent Labs Lab 01/05/15 2304  NA 141  K 4.6  CL 105  CO2 29  GLUCOSE 146*  BUN 31*  CREATININE 0.94  CALCIUM 9.7   Liver Function Tests: No results for input(s): AST, ALT, ALKPHOS, BILITOT, PROT, ALBUMIN in the last 168 hours. No results for input(s): LIPASE, AMYLASE in the last 168 hours. No results for input(s): AMMONIA in the last 168 hours. CBC:  Recent Labs Lab  01/05/15 2304  WBC 7.4  NEUTROABS 4.4  HGB 10.3*  HCT 33.1*  MCV 103.4*  PLT 144*   Cardiac Enzymes:  Recent Labs Lab 01/05/15 2304  TROPONINI 0.21*    BNP (last 3 results)  Recent Labs  01/05/15 2304  BNP 2380.1*    ProBNP (last 3 results) No results for input(s): PROBNP in the last 8760 hours.  CBG: No results for input(s): GLUCAP in the last 168 hours.  Radiological Exams on Admission: Dg Chest 2 View  01/05/2015  CLINICAL DATA:  Pt states severe rt anteriot chest/rib pains x 2 weeks +,hx past PE, htn, non smoker, she states no known injury or fall EXAM: CHEST - 2 VIEW COMPARISON:  05/06/2012 FINDINGS: Interval development of moderate interstitial edema or infiltrates. Interval development of moderate bilateral pleural effusions, with adjacent consolidation/ atelectasis in both lung bases. Remote L2 kyphoplasty/vertebroplasty. Compression deformities of  T11 and T12 as before. Atheromatous aorta. Mild cardiomegaly suspected, difficult to accurately assess due to adjacent opacities. IMPRESSION: 1. New bilateral interstitial edema, moderate effusions, and bibasilar atelectasis. Electronically Signed   By: Corlis Leak  Hassell M.D.   On: 01/05/2015 23:32    EKG: Independently reviewed. QTC 452, and ST elevation in V1-V2, septal-anterior infarction pattern in V1 to V3. No old EKG to compare   Assessment/Plan Principal Problem:   Acute respiratory failure with hypoxia (HCC) Active Problems:   Essential hypertension   Aortic valve disease   Osteoarthritis of left knee   Anxiety   Acute CHF (congestive heart failure) (HCC)   Elevated troponin   Chest pain   SOB (shortness of breath)  Acute respiratory failure with hypoxia [facility](HCC): Potential differential diagnoses include acute congestive heart failure (given elevated BNP, bilateral leg edema and positive JVD) and PE (given pleuritic chest pain and history of PE). No  pneumonia on chest x-ray.   -will admit to SDU. -one dose of  lasix was ordered by ED, 40 mg x 1 by IV. Will not give more lasix until ruling out PE. -2d echo -ASA -start lisinopril 5 mg daily -Daily weights -strict I/O's -Low salt diet  -No BB due to acute decompensation of CHF -CT angiogram to r/o PE -f/u cardiologist recommendation  Addendum: CAT is negative for PE.  -Card ordered lasix 40 mg q6h for two dose.  Elevated troponin: trop 0.21. EKG showed septal-anterior wall infarction which is likely the triggering factor for acute CHF - cycle CE q6 x3 and repeat her EKG in the am  - Nitroglycerin, Morphine, and aspirin, lipitor  - Risk factor stratification: will check FLP and A1C  - 2d echo -started IV heparin   Essential hypertension: -Amlodipine, lisinopril  Osteoarthritis of left knee: -prn Norco  Anxiety: -continue home Ativan   DVT ppx: on IV  Heparin   Code Status: DNR Family Communication:  Yes, patient's grandson who is a caregiver  at bed side Disposition Plan: Admit to inpatient   Date of Service 01/06/2015    Lorretta HarpIU, Nyashia Raney [facility] Pager [phone]  If 7PM-7AM, please contact night-coverage [email or web address] Password [email or web address] 01/06/2015, 1:51 AM

## 2015-01-06 NOTE — ED Notes (Signed)
Pt desat to 88% RA, placed on 2L O2.

## 2015-01-06 NOTE — Progress Notes (Signed)
ANTICOAGULATION CONSULT NOTE - Initial Consult  Pharmacy Consult for Heparin Indication: chest pain/ACS  Allergies  Allergen Reactions  . Ultracet [Tramadol-Acetaminophen] Other (See Comments)    confusion  . Penicillins Rash    Patient Measurements: Height: 5\' 4"  (162.6 cm) Weight: 112 lb (50.803 kg) IBW/kg (Calculated) : 54.7  Vital Signs: Temp: 97.6 F (36.4 C) (11/15 2256) Temp Source: Axillary (11/15 2256) BP: 141/107 mmHg (11/16 0119) Pulse Rate: 89 (11/16 0119)  Labs:  Recent Labs  01/05/15 2304  HGB 10.3*  HCT 33.1*  PLT 144*  CREATININE 0.94  TROPONINI 0.21*    Estimated Creatinine Clearance: 32.5 mL/min (by C-G formula based on Cr of 0.94).   Medical History: Past Medical History  Diagnosis Date  . Pulmonary embolus (HCC) 2007    status post Coumadin  . Hypertension   . Polymyalgia rheumatica (HCC)   . Osteoporosis   . Osteoarthritis of left knee   . Carotid artery occlusion     carotid bruits no ICA stenosis  . Anxiety   . PVC (premature ventricular contraction)   . Rotator cuff tendinitis   . Hyponatremia 05/2009    Mild  . Aortic stenosis, mild last echo 08/2009    pt declines further echoes 08/2010  . History of shingles 2012    Medications:  Norvasc  Oscal-D  Vit D  MVI    Assessment: 81 y.o. female with SOB/CHF, elevated cardiac markers, for heparin  Goal of Therapy:  Heparin level 0.3-0.7 units/ml Monitor platelets by anticoagulation protocol: Yes   Plan: Heparin 3000 units IV bolus, then start heparin 600 units/hr Check heparin level in 8 hours.    Naly Schwanz, Gary FleetGregory Vernon 01/06/2015,1:36 AM

## 2015-01-06 NOTE — Care Management Note (Signed)
Case Management Note  Patient Details  Name: Karilyn CotaDorothy B Osorto MRN: [id number] Date of Birth: 09/25/1923  Subjective/Objective:     Adm w chf and ch pain               Action/Plan: lives w fam, pcp dr Ann Makihal [place]  Expected Discharge Date:                  Expected Discharge Plan:     In-House Referral:     Discharge planning Services     Post Acute Care Choice:    Choice offered to:     DME Arranged:    DME Agency:     HH Arranged:    HH Agency:     Status of Service:     Medicare Important Message Given:    Date Medicare IM Given:    Medicare IM give by:    Date Additional Medicare IM Given:    Additional Medicare Important Message give by:     If discussed at Long Length of Stay Meetings, dates discussed:    Additional Comments: ur review done, will moniter for hhc needs as pt progresses  Hanley HaysDowell, Taleigh Gero T, RN 01/06/2015, 8:40 AM

## 2015-01-06 NOTE — Consult Note (Signed)
CARDIOLOGY INPATIENT CONSULTATION NOTE  Patient ID: Natalie Arnold MRN: [id number], DOB/AGE: [birth year]   Admit date: 01/05/2015   Primary Physician: Ginette Otto, MD Primary Cardiologist: none  Reason for Consult:   Elevated troponin  Requesting Physician: Dr. Lorretta Harp   HPI: This is a 81 y.o. female with no prior history of CAD but has h/o of PE in 2007, severe aortic stenosis (by notes), OA who presented with few days history of dyspnea. Patient is a poor historian. History is also obtained from her caregiver [name]) who lives with her.  According to the patient and the grandson, she was doing well 2 days ago when she started having increased SOB. This was associated with orthopnea, PND and increased lower leg swelling with weight gain. She also complained of right sided pleuritic pain which started with dry cough. She has been having dry cough associated with dyspnea as well. She denied having any days with severe chest pain in the past or having any recent viral infection. She denied having any bleeding episodes, stroke, UTI, fevers, chills. The chest pain is described as sharp, radiates to the right side, moderate to severe in intensity and gets worse with deep breathing or cough. It does not relieve with any known thing.  On arrival, her O2 sats were 88% she was tachypneic and tachycardiac. Her BNP was 2380, troponin was 0.21 and CXR showed interstitial edema bilaterally.  Cardiology was consulted due to elevated troponin and if patient needs cardiac catheterization. EKG showed normal sinus rhythm with remote anteroseptal infarct with residual ST elevation.   Problem List: Past Medical History  Diagnosis Date  . Pulmonary embolus (HCC) 2007    status post Coumadin  . Hypertension   . Polymyalgia rheumatica (HCC)   . Osteoporosis   . Osteoarthritis of left knee   . Carotid artery occlusion     carotid bruits no ICA stenosis  . Anxiety   . PVC (premature  ventricular contraction)   . Rotator cuff tendinitis   . Hyponatremia 05/2009    Mild  . Aortic stenosis, mild last echo 08/2009    pt declines further echoes 08/2010  . History of shingles 2012    Past Surgical History  Procedure Laterality Date  . Abdonimal hysterectomy    . Skin cancer excision       Allergies:  Allergies  Allergen Reactions  . Ultracet [Tramadol-Acetaminophen] Other (See Comments)    confusion  . Penicillins Rash     Home Medications Current Facility-Administered Medications  Medication Dose Route Frequency Provider Last Rate Last Dose  . albuterol (PROVENTIL) (2.5 MG/3ML) 0.083% nebulizer solution 2.5 mg  2.5 mg Nebulization Q4H PRN Lorretta Harp, MD      . amLODipine (NORVASC) tablet 2.5 mg  2.5 mg Oral Daily Lorretta Harp, MD      . aspirin chewable tablet 324 mg  324 mg Oral Once Dione Booze, MD      . aspirin EC tablet 325 mg  325 mg Oral Daily Lorretta Harp, MD      . atorvastatin (LIPITOR) tablet 40 mg  40 mg Oral q1800 Lorretta Harp, MD      . Calcium Carbonate-Vitamin D 600-400 MG-UNIT 2 tablet  2 tablet Oral Daily Lorretta Harp, MD      . cholecalciferol (VITAMIN D) tablet 2,000 Units  2,000 Units Oral Daily Lorretta Harp, MD      . heparin ADULT infusion 100 units/mL (25000 units/250 mL)  600 Units/hr Intravenous Continuous Dione Booze,  MD      . heparin bolus via infusion 3,000 Units  3,000 Units Intravenous Once Dione Booze, MD      . HYDROcodone-acetaminophen (NORCO/VICODIN) 5-325 MG per tablet 1 tablet  1 tablet Oral Q6H PRN Lorretta Harp, MD      . lisinopril (PRINIVIL,ZESTRIL) tablet 2.5 mg  2.5 mg Oral Daily Lorretta Harp, MD      . LORazepam (ATIVAN) tablet 0.5 mg  0.5 mg Oral BID PRN Lorretta Harp, MD      . magnesium hydroxide (MILK OF MAGNESIA) suspension 30 mL  30 mL Oral Daily PRN Lorretta Harp, MD      . meclizine (ANTIVERT) tablet 12.5 mg  12.5 mg Oral PRN Lorretta Harp, MD      . morphine 2 MG/ML injection 2 mg  2 mg Intravenous Q4H PRN Lorretta Harp, MD      . multivitamin  tablet 1 tablet  1 tablet Oral Daily Lorretta Harp, MD      . nitroGLYCERIN (NITROSTAT) SL tablet 0.4 mg  0.4 mg Sublingual Q5 min PRN Lorretta Harp, MD      . ondansetron [facility]) tablet 4 mg  4 mg Oral Q6H PRN Lorretta Harp, MD       Or  . ondansetron [facility]) injection 4 mg  4 mg Intravenous Q6H PRN Lorretta Harp, MD      . sennosides-docusate sodium (SENOKOT-S) 8.6-50 MG tablet 1 tablet  1 tablet Oral Daily PRN Lorretta Harp, MD      . sodium chloride 0.9 % injection 3 mL  3 mL Intravenous Q12H Lorretta Harp, MD       Current Outpatient Prescriptions  Medication Sig Dispense Refill  . amLODipine (NORVASC) 2.5 MG tablet Take 2.5 mg by mouth daily.    . Calcium Carbonate-Vitamin D (CALCIUM 600+D) 600-400 MG-UNIT per tablet Take 2 tablets by mouth daily.     . cholecalciferol (VITAMIN D) 1000 UNITS tablet Take 2,000 Units by mouth daily.     Marland Kitchen HYDROcodone-acetaminophen (NORCO/VICODIN) 5-325 MG per tablet Take 1 tablet by mouth every 6 (six) hours as needed for moderate pain.    Marland Kitchen LORazepam (ATIVAN) 1 MG tablet Take 0.5 mg by mouth 2 (two) times daily as needed for anxiety.     . magnesium hydroxide (MILK OF MAGNESIA) 400 MG/5ML suspension Take 30 mLs by mouth daily as needed for mild constipation.    . meclizine (ANTIVERT) 12.5 MG tablet Take 12.5 mg by mouth as needed for dizziness.    . Multiple Vitamin (MULTIVITAMIN) tablet Take 1 tablet by mouth daily.    . sennosides-docusate sodium (SENOKOT-S) 8.6-50 MG tablet Take 1 tablet by mouth daily as needed for constipation.       Family History  Problem Relation Age of Onset  . Emphysema Brother      Social History   Social History  . Marital Status: Divorced    Spouse Name: N/A  . Number of Children: N/A  . Years of Education: N/A   Occupational History  . Not on file.   Social History Main Topics  . Smoking status: Never Smoker   . Smokeless tobacco: Not on file  . Alcohol Use: No  . Drug Use: Not on file  . Sexual Activity: Not on file    Other Topics Concern  . Not on file   Social History Narrative     Review of Systems: General: fatigue increase weight negative for chills, fever, night sweats  Cardiovascular: leg edema, dyspnea and chest  pain Dermatological: negative for rash Respiratory: dry cough negative for wheezing  Urologic: negative for hematuria Abdominal: negative for nausea, vomiting, diarrhea, bright red blood per rectum, melena, or hematemesis Neurologic: negative for visual changes, syncope, or dizziness Hematology: mild anemia Psychiatry: non suicidal/homicidal  Musculoskeletal: back pain   Physical Exam: Vitals: BP 160/82 mmHg  Pulse 84  Temp(Src) 97.6 F (36.4 C) (Axillary)  Resp 18  Ht 5\' 4"  (1.626 m)  Wt 50.803 kg (112 lb)  BMI 19.22 kg/m2  SpO2 100% General: not in acute distress Neck: JVP is elevated Heart: regular rate and rhythm, normal S1, ejection systolic harsh murmur grade III/VI best heard on right upper sternal border,radiates to the neck with parvus et tardus and absent S2 sound. It gets accentuated with expiration Lungs: bilateral end-expiratory crackles present that do not clear with cough  GI: non tender, non distended, bowel sounds present Extremities: 2+ bilateral symmetric edema with skin changes Neuro: AAO x 3  Psych: normal affect, no anxiety   Labs:   Results for orders placed or performed during the hospital encounter of 01/05/15 (from the past 24 hour(s))  Basic metabolic panel     Status: Abnormal   Collection Time: 01/05/15 11:04 PM  Result Value Ref Range   Sodium 141 135 - 145 mmol/L   Potassium 4.6 3.5 - 5.1 mmol/L   Chloride 105 101 - 111 mmol/L   CO2 29 22 - 32 mmol/L   Glucose, Bld 146 (H) 65 - 99 mg/dL   BUN 31 (H) 6 - 20 mg/dL   Creatinine, Ser 0.450.94 0.44 - 1.00 mg/dL   Calcium 9.7 8.9 - 40.910.3 mg/dL   GFR calc non Af Amer 52 (L) >60 mL/min   GFR calc Af Amer >60 >60 mL/min   Anion gap 7 5 - 15  CBC with Differential     Status: Abnormal    Collection Time: 01/05/15 11:04 PM  Result Value Ref Range   WBC 7.4 4.0 - 10.5 K/uL   RBC 3.20 (L) 3.87 - 5.11 MIL/uL   Hemoglobin 10.3 (L) 12.0 - 15.0 g/dL   HCT 81.133.1 (L) 91.436.0 - 78.246.0 %   MCV 103.4 (H) 78.0 - 100.0 fL   MCH 32.2 26.0 - 34.0 pg   MCHC 31.1 30.0 - 36.0 g/dL   RDW 95.615.3 21.311.5 - 08.615.5 %   Platelets 144 (L) 150 - 400 K/uL   Neutrophils Relative % 60 %   Neutro Abs 4.4 1.7 - 7.7 K/uL   Lymphocytes Relative 31 %   Lymphs Abs 2.3 0.7 - 4.0 K/uL   Monocytes Relative 7 %   Monocytes Absolute 0.5 0.1 - 1.0 K/uL   Eosinophils Relative 2 %   Eosinophils Absolute 0.1 0.0 - 0.7 K/uL   Basophils Relative 0 %   Basophils Absolute 0.0 0.0 - 0.1 K/uL  Brain natriuretic peptide     Status: Abnormal   Collection Time: 01/05/15 11:04 PM  Result Value Ref Range   B Natriuretic Peptide 2380.1 (H) 0.0 - 100.0 pg/mL  Troponin I     Status: Abnormal   Collection Time: 01/05/15 11:04 PM  Result Value Ref Range   Troponin I 0.21 (H) <0.031 ng/mL     Radiology/Studies: Dg Chest 2 View  01/05/2015  CLINICAL DATA:  Pt states severe rt anteriot chest/rib pains x 2 weeks +,hx past PE, htn, non smoker, she states no known injury or fall EXAM: CHEST - 2 VIEW COMPARISON:  05/06/2012 FINDINGS: Interval development of moderate  interstitial edema or infiltrates. Interval development of moderate bilateral pleural effusions, with adjacent consolidation/ atelectasis in both lung bases. Remote L2 kyphoplasty/vertebroplasty. Compression deformities of T11 and T12 as before. Atheromatous aorta. Mild cardiomegaly suspected, difficult to accurately assess due to adjacent opacities. IMPRESSION: 1. New bilateral interstitial edema, moderate effusions, and bibasilar atelectasis. Electronically Signed   By: Corlis Leak M.D.   On: 01/05/2015 23:32    EKG: sinus rhythm with remote anteroseptal infarct with residual ST elevation.   Echo: not available  Cardiac cath: NA  Medical decision making:  Discussed care  with the patient Discussed care with the ED physician on the phone Reviewed labs, EKG and imaging personally Reviewed prior records  ASSESSMENT AND PLAN:  This is a 81 y.o. female with no known history of CAD with EKG suggestive of remote anteroseptal infarct with mildly elevated troponin and symptoms and signs of heart failure with elevated BNP   Principal Problem:   Acute respiratory failure with hypoxia (HCC) Active Problems:   Essential hypertension   Aortic valve disease   Osteoarthritis of left knee   Anxiety   Acute CHF (congestive heart failure) (HCC)   Elevated troponin   Chest pain   SOB (shortness of breath)  Acute CHF exacerbation/acute hypoxic respiratory failure secondary to CHF, NYHA class IV, unknown LVEF IV diuresis with lasix, echocardiogram to evaluate for AS severity and LVEF, keep MAP <90 with ACEi and vasodilators like nitrates  Severe AS, symptomatic - echo in AM, discussed with patient but patient does not want TAVR right now. However she might change her mind later. She will like to be DNR/DNI  Elevated troponin - cycle troponin. Likely resolving MI or CHF related. However treat medically for NSTEMI until more troponins/echo is available. Start on aspirin, high dose statin. Patient refused cardiac catheterization  Hypertension, essential - control bp with MAP <90 and systolic <140  Pulmonary edema - as above   Signed, Joellyn Rued, MD MS 01/06/2015, 2:24 AM

## 2015-01-06 NOTE — Progress Notes (Signed)
[facility] PROGRESS NOTE  Natalie Arnold ZOX:[id number] DOB: 12/18/1923 DOA: 01/05/2015 PCP: Ginette OttoSTONEKING,HAL THOMAS, MD  Addendum to same day admission :   Assessment/Plan: Acute respiratory failure with hypoxia Secondary to acute CHF exacerbation and NSTEMI. Has severe underlying AS as well. CT angio cheest negative for PE. IV lasix 40 mg q 6h. Strict I/O. 2 D echo ASA, ACEi.   NSTEMI  elevated troponin could be associated with demand ischemia with CHF and severe AS. started on IV heparin. Cycle enzymes, 2d echo.  ASA, lipitor. Holding BB due to acute CHF. Cardiology following.  Essential HTN  low dose Amlodipine and ACEi.  Left knee osteoarthritis  prn norco.  anxiety  continue ativan prn  Diet: heart healthy  DVT prophylaxis: IV heparin   Code Status: DNR Family Communication: none at bedside Disposition Plan: continue stepdown monitoring   Consultants:  cardiology  Procedures:  CT angio chest   2 D echo ( pending)  Antibiotics:  none  HPI/Subjective: Seen and examined. Reports rt sided chest pain. Dyspnea only slightly better  Objective: Filed Vitals:   01/06/15 0718  BP: 134/82  Pulse: 87  Temp: 97.9 F (36.6 C)  Resp: 20    Intake/Output Summary (Last 24 hours) at 01/06/15 0817 Last data filed at 01/06/15 0600  Gross per 24 hour  Intake     33 ml  Output   1075 ml  Net  -1042 ml   Filed Weights   01/06/15 0119 01/06/15 0300  Weight: 50.803 kg (112 lb) 54 kg (119 lb 0.8 oz)    Exam:   General:  Appears fatigued, not in distress   HEENT: no pallor, JVD++, moist mucosa  chest: diminished bibasilar breath sounds  Cardiovascular: s17&2 regular 4/6 systolic murmur  GI: soft, NT, ND, bs+  Musculoskeletal: Warm, no edema  CNS: alert and oriented  Data Reviewed: Basic Metabolic Panel:  Recent Labs Lab 01/05/15 2304 01/06/15 0214  NA 141 141  K 4.6 4.4  CL 105 103  CO2 29 28  GLUCOSE 146* 119*  BUN 31* 31*   CREATININE 0.94 1.01*  CALCIUM 9.7 9.9   Liver Function Tests:  Recent Labs Lab 01/06/15 0214  AST 58*  ALT 37  ALKPHOS 65  BILITOT 0.8  PROT 7.3  ALBUMIN 4.0   No results for input(s): LIPASE, AMYLASE in the last 168 hours. No results for input(s): AMMONIA in the last 168 hours. CBC:  Recent Labs Lab 01/05/15 2304 01/06/15 0214  WBC 7.4 7.2  NEUTROABS 4.4  --   HGB 10.3* 10.2*  HCT 33.1* 32.7*  MCV 103.4* 102.5*  PLT 144* 133*   Cardiac Enzymes:  Recent Labs Lab 01/05/15 2304 01/06/15 0214  TROPONINI 0.21* 0.21*   BNP (last 3 results)  Recent Labs  01/05/15 2304  BNP 2380.1*    ProBNP (last 3 results) No results for input(s): PROBNP in the last 8760 hours.  CBG:  Recent Labs Lab 01/06/15 0722  GLUCAP 102*    Recent Results (from the past 240 hour(s))  MRSA PCR Screening     Status: None   Collection Time: 01/06/15  2:44 AM  Result Value Ref Range Status   MRSA by PCR NEGATIVE NEGATIVE Final    Comment:        The GeneXpert MRSA Assay (FDA approved for NASAL specimens only), is one component of a comprehensive MRSA colonization surveillance program. It is not intended to diagnose MRSA infection nor to guide or monitor treatment for MRSA  infections.      Studies: Dg Chest 2 View  01/05/2015  CLINICAL DATA:  Pt states severe rt anteriot chest/rib pains x 2 weeks +,hx past PE, htn, non smoker, she states no known injury or fall EXAM: CHEST - 2 VIEW COMPARISON:  05/06/2012 FINDINGS: Interval development of moderate interstitial edema or infiltrates. Interval development of moderate bilateral pleural effusions, with adjacent consolidation/ atelectasis in both lung bases. Remote L2 kyphoplasty/vertebroplasty. Compression deformities of T11 and T12 as before. Atheromatous aorta. Mild cardiomegaly suspected, difficult to accurately assess due to adjacent opacities. IMPRESSION: 1. New bilateral interstitial edema, moderate effusions, and  bibasilar atelectasis. Electronically Signed   By: Corlis Leak M.D.   On: 01/05/2015 23:32   Ct Angio Chest Pe W/cm &/or Wo Cm  01/06/2015  CLINICAL DATA:  Shortness of breath, acute congestive heart failure. Pleuritic chest pain. History of pulmonary embolus 9 years prior in 2007. EXAM: CT ANGIOGRAPHY CHEST WITH CONTRAST TECHNIQUE: Multidetector CT imaging of the chest was performed using the standard protocol during bolus administration of intravenous contrast. Multiplanar CT image reconstructions and MIPs were obtained to evaluate the vascular anatomy. CONTRAST:  80mL OMNIPAQUE IOHEXOL 350 MG/ML SOLN COMPARISON:  Radiographs earlier this day.  Chest CT 11/16/2005 FINDINGS: Previous thrombus in the pulmonary arteries has resolved. There are no filling defects to suggest acute pulmonary embolus. Limited assessment of the subsegmental branches, likely secondary to diminished cardiac output. Borderline cardiomegaly with dilatation of the right atrium and ventricle and contrast refluxing into the IVC. Dense mitral and aortic annular calcifications. The thoracic aorta is normal in caliber with moderate atherosclerosis. Distal descending thoracic aorta is tortuous. Large bilateral pleural effusions with compressive atelectasis in the lower and right middle lobes. Ground-glass opacities in smooth septal thickening in the aerated lungs. Narrowing of the anterior segment of the right middle lobe with atelectasis likely secondary to compression from pleural effusion. Evaluation of the upper abdomen demonstrates large cyst in the mid right kidney, partially included, measuring at 8 cm. No evidence of acute upper abdominal abnormality. Exaggerated thoracic kyphosis. Multiple compression deformities in the lower thoracic and upper lumbar spine. Kyphoplasty within L2. Moderate to severe compression deformity of T12. The bones are under mineralized. There is a remote sternal fracture, however new from 2007. Review of the MIP  images confirms the above findings. IMPRESSION: 1. No pulmonary embolus. 2. Congestive heart failure with large bilateral pleural effusions causing compressive atelectasis at the lung bases. 3. Atherosclerosis of the thoracic aorta with mitral and aortic annulus calcifications. Electronically Signed   By: Rubye Oaks M.D.   On: 01/06/2015 02:45    Scheduled Meds: . amLODipine  2.5 mg Oral Daily  . antiseptic oral rinse  7 mL Mouth Rinse BID  . aspirin  81 mg Oral Daily  . atorvastatin  80 mg Oral q1800  . calcium-vitamin D  2 tablet Oral Q breakfast  . cholecalciferol  2,000 Units Oral Daily  . furosemide  40 mg Intravenous Q6H  . lisinopril  5 mg Oral Daily  . multivitamin with minerals  1 tablet Oral Daily  . sodium chloride  3 mL Intravenous Q12H   Continuous Infusions: . sodium chloride 4 mL/hr at 01/06/15 0300  . heparin 600 Units/hr (01/06/15 0226)      Time spent: 25 minutes    Angas Isabell  [facility] Pager [phone]. If 7PM-7AM, please contact night-coverage at [email or web address], password [facility] 01/06/2015, 8:17 AM  LOS: 0 days

## 2015-01-06 NOTE — Progress Notes (Signed)
  Echocardiogram 2D Echocardiogram has been performed.  Nolon RodBrown, Tony 01/06/2015, 4:55 PM

## 2015-01-06 NOTE — ED Notes (Signed)
Pt transported to CT, 2H notified of delay.

## 2015-01-06 NOTE — Progress Notes (Signed)
ANTICOAGULATION CONSULT NOTE   Pharmacy Consult for Heparin Indication: chest pain/ACS  Allergies  Allergen Reactions  . Ultracet [Tramadol-Acetaminophen] Other (See Comments)    confusion  . Penicillins Rash    Patient Measurements: Height: 5\' 4"  (162.6 cm) Weight: 119 lb 0.8 oz (54 kg) IBW/kg (Calculated) : 54.7  Vital Signs: Temp: 97.9 F (36.6 C) (11/16 1101) Temp Source: Oral (11/16 1101) BP: 126/74 mmHg (11/16 1101) Pulse Rate: 73 (11/16 1101)  Labs:  Recent Labs  01/05/15 2304 01/06/15 0214 01/06/15 0654 01/06/15 0713 01/06/15 1300  HGB 10.3* 10.2*  --   --   --   HCT 33.1* 32.7*  --   --   --   PLT 144* 133*  --   --   --   APTT  --  28  --   --   --   LABPROT  --  15.0  --   --   --   INR  --  1.17  --   --   --   HEPARINUNFRC  --   --  0.35  --   --   CREATININE 0.94 1.01*  --   --   --   TROPONINI 0.21* 0.21*  --  0.19* 0.19*    Estimated Creatinine Clearance: 32.2 mL/min (by C-G formula based on Cr of 1.01).   Medical History: Past Medical History  Diagnosis Date  . Pulmonary embolus (HCC) 2007    status post Coumadin  . Hypertension   . Polymyalgia rheumatica (HCC)   . Osteoporosis   . Osteoarthritis of left knee   . Carotid artery occlusion     carotid bruits no ICA stenosis  . Anxiety   . PVC (premature ventricular contraction)   . Rotator cuff tendinitis   . Hyponatremia 05/2009    Mild  . Aortic stenosis, mild last echo 08/2009    pt declines further echoes 08/2010  . History of shingles 2012    Medications:  Norvasc  Oscal-D  Vit D  MVI    Assessment: 81 y.o. female with SOB/CHF, elevated cardiac markers, started on IV heparin.   Initial HL is within therapeutic range at 0.35, no bleeding issues noted, cbc within normal limits.  CT chest neg for PE (hx of PE in 2007), TpI flat at 0.19. Cath refused by patient.  Goal of Therapy:  Heparin level 0.3-0.7 units/ml Monitor platelets by anticoagulation protocol: Yes    Plan: Continue heparin at 600 units/hr Recheck heparin level in 8 hours then daily  Sheppard CoilFrank Wilson PharmD., BCPS Clinical Pharmacist Pager [phone] 01/06/2015 2:15 PM

## 2015-01-06 NOTE — Progress Notes (Signed)
PT Cancellation Note  Patient Details Name: Natalie CotaDorothy B Arnold MRN: [id number] DOB: 03/23/1923   Cancelled Treatment:    Reason Eval/Treat Not Completed: Patient at procedure or test/unavailable; currently in Echo.  Will attempt to see tomorrow.   Elihue Ebert,CYNDI 01/06/2015, 4:14 PM  Sheran Lawlessyndi Caelen Reierson, [place]PT [phone] 01/06/2015

## 2015-01-06 NOTE — Progress Notes (Signed)
Initial Nutrition Assessment  DOCUMENTATION CODES:   Non-severe (moderate) malnutrition in context of chronic illness  INTERVENTION:   -Ensure Enlive po BID, each supplement provides 350 kcal and 20 grams of protein -Continue MVI daily  NUTRITION DIAGNOSIS:   Malnutrition related to chronic illness as evidenced by energy intake < 75% for > or equal to 1 month, moderate depletion of body fat, moderate depletions of muscle mass.  GOAL:   Patient will meet greater than or equal to 90% of their needs  MONITOR:   PO intake, Supplement acceptance, Labs, Weight trends, Skin, I & O's  REASON FOR ASSESSMENT:   Malnutrition Screening Tool    ASSESSMENT:   Natalie Arnold is a 81 y.o. female with PMH of PE 2007, hypertension, anxiety, PMR, PVC, aortic stenosis, OP, OA of left knee, who presents with shortness of breath and chest pain.  Pt admitted with acute respiratory failure with hypoxias and acute CHF exacerbation.   Hx obtained from pt at bedside. She reports her appetite is chronically poor; she does not feel like eating much "due to my old age". PTA she estimates that she consumes 1 meal per day (which is prepared by her grandson, with whom she lives). However, upon further probing, pt is unable to identify commonly eaten foods. She denies any difficulty chewing or swallowing foods, despite poor dentition.   She reports that she has been progressively losing weight "for over a year". She is unsure of UBW and wt hx is unavailable in chart.   Nutrition-Focused physical exam completed. Findings are mild to moderate fat depletion, mild to moderate muscle depletion, and mild edema. Pt reports she has poor balance at baseline and typically use a wheelchair when she leaves the house.  Discussed importance of good PO intake to promote healing. Pt amenable to Ensure supplements to improve nutritional status.   Labs reviewed. Diet Order:  Diet 2 gram sodium Room service appropriate?: Yes;  Fluid consistency:: Thin  Skin:  Reviewed, no issues  Last BM:  PTA  Height:   Ht Readings from Last 1 Encounters:  01/06/15 5\' 4"  (1.626 m)    Weight:   Wt Readings from Last 1 Encounters:  01/06/15 119 lb 0.8 oz (54 kg)    Ideal Body Weight:  54.5 kg  BMI:  Body mass index is 20.42 kg/(m^2).  Estimated Nutritional Needs:   Kcal:  1350-1550  Protein:  60-70 grams  Fluid:  1.3-1.5 L  EDUCATION NEEDS:   Education needs addressed  Chanya Chrisley A. Mayford KnifeWilliams, RD, LDN, CDE Pager: [phone] After hours Pager: [phone]

## 2015-01-06 NOTE — Progress Notes (Signed)
ANTICOAGULATION CONSULT NOTE - Follow Up Consult  Pharmacy Consult for heparin Indication: chest pain/ACS  Allergies  Allergen Reactions  . Ultracet [Tramadol-Acetaminophen] Other (See Comments)    confusion  . Penicillins Rash    Patient Measurements: Height: 5\' 4"  (162.6 cm) Weight: 119 lb 0.8 oz (54 kg) IBW/kg (Calculated) : 54.7  Vital Signs: Temp: 98.2 F (36.8 C) (11/16 1915) Temp Source: Oral (11/16 1915) BP: 97/76 mmHg (11/16 1915) Pulse Rate: 78 (11/16 1915)  Labs:  Recent Labs  01/05/15 2304 01/06/15 0214 01/06/15 0654 01/06/15 0713 01/06/15 1300 01/06/15 1852  HGB 10.3* 10.2*  --   --   --   --   HCT 33.1* 32.7*  --   --   --   --   PLT 144* 133*  --   --   --   --   APTT  --  28  --   --   --   --   LABPROT  --  15.0  --   --   --   --   INR  --  1.17  --   --   --   --   HEPARINUNFRC  --   --  0.35  --   --  0.24*  CREATININE 0.94 1.01*  --   --   --   --   TROPONINI 0.21* 0.21*  --  0.19* 0.19*  --     Estimated Creatinine Clearance: 32.2 mL/min (by C-G formula based on Cr of 1.01).   Medications:  . sodium chloride 4 mL/hr at 01/06/15 0300  . heparin 600 Units/hr (01/06/15 0226)    Assessment: 81 y/o female on IV heparin for chest pain. Heparin level is subtherapeutic at 0.24 on 600 units/hr. No bleeding noted. EF is 60%.  Goal of Therapy:  Heparin level 0.3-0.7 units/ml Monitor platelets by anticoagulation protocol: Yes   Plan:  - Increase heparin drip to 700 units/hr - 8 hr heparin level - Daily heparin level and CBC - Monitor for s/sx of bleeding  Nantuck[facility] Cottage HospitalJennifer [place], PomeroyPharm.D., BCPS Clinical Pharmacist Pager: [phone] 01/06/2015 8:52 PM

## 2015-01-07 DIAGNOSIS — I5031 Acute diastolic (congestive) heart failure: Secondary | ICD-10-CM

## 2015-01-07 DIAGNOSIS — R7989 Other specified abnormal findings of blood chemistry: Secondary | ICD-10-CM

## 2015-01-07 DIAGNOSIS — E440 Moderate protein-calorie malnutrition: Secondary | ICD-10-CM

## 2015-01-07 DIAGNOSIS — I272 Other secondary pulmonary hypertension: Secondary | ICD-10-CM

## 2015-01-07 DIAGNOSIS — M1712 Unilateral primary osteoarthritis, left knee: Secondary | ICD-10-CM

## 2015-01-07 DIAGNOSIS — I350 Nonrheumatic aortic (valve) stenosis: Secondary | ICD-10-CM

## 2015-01-07 LAB — CBC
HEMATOCRIT: 32.6 % — AB (ref 36.0–46.0)
HEMOGLOBIN: 10.4 g/dL — AB (ref 12.0–15.0)
MCH: 32.9 pg (ref 26.0–34.0)
MCHC: 31.9 g/dL (ref 30.0–36.0)
MCV: 103.2 fL — ABNORMAL HIGH (ref 78.0–100.0)
Platelets: 172 10*3/uL (ref 150–400)
RBC: 3.16 MIL/uL — AB (ref 3.87–5.11)
RDW: 15.4 % (ref 11.5–15.5)
WBC: 8.9 10*3/uL (ref 4.0–10.5)

## 2015-01-07 LAB — BASIC METABOLIC PANEL
ANION GAP: 9 (ref 5–15)
BUN: 28 mg/dL — ABNORMAL HIGH (ref 6–20)
CHLORIDE: 98 mmol/L — AB (ref 101–111)
CO2: 34 mmol/L — AB (ref 22–32)
CREATININE: 1.08 mg/dL — AB (ref 0.44–1.00)
Calcium: 9.2 mg/dL (ref 8.9–10.3)
GFR calc non Af Amer: 44 mL/min — ABNORMAL LOW (ref >60)
GFR, EST AFRICAN AMERICAN: 51 mL/min — AB (ref >60)
Glucose, Bld: 111 mg/dL — ABNORMAL HIGH (ref 65–99)
POTASSIUM: 4.1 mmol/L (ref 3.5–5.1)
SODIUM: 141 mmol/L (ref 135–145)

## 2015-01-07 LAB — HEMOGLOBIN A1C
Hgb A1c MFr Bld: 5.3 % (ref 4.8–5.6)
Mean Plasma Glucose: 105 mg/dL

## 2015-01-07 LAB — GLUCOSE, CAPILLARY: GLUCOSE-CAPILLARY: 102 mg/dL — AB (ref 65–99)

## 2015-01-07 LAB — HEPARIN LEVEL (UNFRACTIONATED): HEPARIN UNFRACTIONATED: 0.39 [IU]/mL (ref 0.30–0.70)

## 2015-01-07 NOTE — Progress Notes (Signed)
ANTICOAGULATION CONSULT NOTE - Follow Up Consult  Pharmacy Consult for Heparin Indication: chest pain/ACS  Allergies  Allergen Reactions  . Ultracet [Tramadol-Acetaminophen] Other (See Comments)    confusion  . Penicillins Rash    Patient Measurements: Height: 5\' 4"  (162.6 cm) Weight: 121 lb 14.6 oz (55.3 kg) IBW/kg (Calculated) : 54.7   Vital Signs: Temp: 97.7 F (36.5 C) (11/17 0802) Temp Source: Oral (11/17 0802) BP: 120/70 mmHg (11/17 0802)  Labs:  Recent Labs  01/05/15 2304 01/06/15 0214 01/06/15 0654 01/06/15 0713 01/06/15 1300 01/06/15 1852 01/07/15 0350  HGB 10.3* 10.2*  --   --   --   --  10.4*  HCT 33.1* 32.7*  --   --   --   --  32.6*  PLT 144* 133*  --   --   --   --  172  APTT  --  28  --   --   --   --   --   LABPROT  --  15.0  --   --   --   --   --   INR  --  1.17  --   --   --   --   --   HEPARINUNFRC  --   --  0.35  --   --  0.24* 0.39  CREATININE 0.94 1.01*  --   --   --   --  1.08*  TROPONINI 0.21* 0.21*  --  0.19* 0.19*  --   --     Estimated Creatinine Clearance: 30.5 mL/min (by C-G formula based on Cr of 1.08).   Medications:  Scheduled:  . amLODipine  2.5 mg Oral Daily  . antiseptic oral rinse  7 mL Mouth Rinse BID  . aspirin  81 mg Oral Daily  . atorvastatin  80 mg Oral q1800  . calcium-vitamin D  2 tablet Oral Q breakfast  . cholecalciferol  2,000 Units Oral Daily  . feeding supplement (ENSURE ENLIVE)  237 mL Oral BID BM  . furosemide  40 mg Intravenous Q6H  . lisinopril  5 mg Oral Daily  . multivitamin with minerals  1 tablet Oral Daily  . sodium chloride  3 mL Intravenous Q12H    Assessment: 81 y.o. female with SOB/CHF, elevated cardiac markers, started on IV heparin. CT chest neg for PE (hx of PE in 2007), TpI flat at 0.19. Cath refused by patient.  Heparin level therapeutic today at 0.39 on 700 units/hr. CBC stable, no bleeding noted.   Goal of Therapy:  Heparin level 0.3-0.7 units/ml Monitor platelets by  anticoagulation protocol: Yes   Plan:  - Continue heparin 700 units/hr - Check daily HL and CBC - Monitor for s/sx of bleeding  Scarlette ArAshley Takima [place] 01/07/2015,10:01 AM

## 2015-01-07 NOTE — Progress Notes (Addendum)
Patient Name: Natalie Arnold Date of Encounter: 01/07/2015  Principal Problem:   Acute respiratory failure with hypoxia [facility](HCC) Active Problems:   Essential hypertension   Aortic valve disease   Osteoarthritis of left knee   Anxiety   Acute CHF (congestive heart failure) (HCC)   Elevated troponin   Chest pain   SOB (shortness of breath)   Malnutrition of moderate degree   Length of Stay: 1  SUBJECTIVE  The patient states that she feels better.   CURRENT MEDS . amLODipine  2.5 mg Oral Daily  . antiseptic oral rinse  7 mL Mouth Rinse BID  . aspirin  81 mg Oral Daily  . atorvastatin  80 mg Oral q1800  . calcium-vitamin D  2 tablet Oral Q breakfast  . cholecalciferol  2,000 Units Oral Daily  . feeding supplement (ENSURE ENLIVE)  237 mL Oral BID BM  . furosemide  40 mg Intravenous Q6H  . lisinopril  5 mg Oral Daily  . multivitamin with minerals  1 tablet Oral Daily  . sodium chloride  3 mL Intravenous Q12H   OBJECTIVE  Filed Vitals:   01/07/15 0800 01/07/15 0802 01/07/15 1000 01/07/15 1001  BP: 120/70 120/70 110/61 110/61  Pulse:      Temp:  97.7 F (36.5 C)    TempSrc:  Oral    Resp: 16 16 16    Height:      Weight:      SpO2:  94%      Intake/Output Summary (Last 24 hours) at 01/07/15 1045 Last data filed at 01/07/15 1000  Gross per 24 hour  Intake 343.07 ml  Output   2550 ml  Net -2206.93 ml   Filed Weights   01/06/15 0119 01/06/15 0300 01/07/15 0509  Weight: 112 lb (50.803 kg) 119 lb 0.8 oz (54 kg) 121 lb 14.6 oz (55.3 kg)   PHYSICAL EXAM  General: Pleasant, NAD. Neuro: Alert and oriented X 3. Moves all extremities spontaneously. Psych: Normal affect. HEENT:  Normal  Neck: Supple without bruits, JVD elevated up to jaws. Lungs:  Resp regular and unlabored, crackles at basis . Heart: RRR no s3, s4, 4/6 systolic and and mild diastolic murmur. Abdomen: Soft, non-tender, non-distended, BS + x 4.  Extremities: No clubbing, cyanosis or edema.  DP/PT/Radials 2+ and equal bilaterally.  Accessory Clinical Findings  CBC  Recent Labs  01/05/15 2304 01/06/15 0214 01/07/15 0350  WBC 7.4 7.2 8.9  NEUTROABS 4.4  --   --   HGB 10.3* 10.2* 10.4*  HCT 33.1* 32.7* 32.6*  MCV 103.4* 102.5* 103.2*  PLT 144* 133* 172   Basic Metabolic Panel  Recent Labs  01/06/15 0214 01/07/15 0350  NA 141 141  K 4.4 4.1  CL 103 98*  CO2 28 34*  GLUCOSE 119* 111*  BUN 31* 28*  CREATININE 1.01* 1.08*  CALCIUM 9.9 9.2   Liver Function Tests  Recent Labs  01/06/15 0214  AST 58*  ALT 37  ALKPHOS 65  BILITOT 0.8  PROT 7.3  ALBUMIN 4.0   Cardiac Enzymes  Recent Labs  01/06/15 0214 01/06/15 0713 01/06/15 1300  TROPONINI 0.21* 0.19* 0.19*    Recent Labs  01/06/15 0214  HGBA1C 5.3   Fasting Lipid Panel  Recent Labs  01/06/15 0214  CHOL 125  HDL 43  LDLCALC 62  TRIG 102  CHOLHDL 2.9   Thyroid Function Tests  Recent Labs  01/06/15 0214  TSH 7.298*   Radiology/Studies  Dg Chest 2  View  01/05/2015  CLINICAL DATA:  Pt states severe rt anteriot chest/rib pains x 2 weeks +,hx past PE, htn, non smoker, she states no known injury or fall EXAM: CHEST - 2 VIEW COMPARISON:  05/06/2012 FINDINGS: Interval development of moderate interstitial edema or infiltrates. Interval development of moderate bilateral pleural effusions, with adjacent consolidation/ atelectasis in both lung bases. Remote L2 kyphoplasty/vertebroplasty. Compression deformities of T11 and T12 as before. Atheromatous aorta. Mild cardiomegaly suspected, difficult to accurately assess due to adjacent opacities. IMPRESSION: 1. New bilateral interstitial edema, moderate effusions, and bibasilar atelectasis. Electronically Signed   By: Corlis Leak M.D.   On: 01/05/2015 23:32   Ct Angio Chest Pe W/cm &/or Wo Cm  01/06/2015  CLINICAL DATA:  Shortness of breath, acute congestive heart failure. Pleuritic chest pain. History of pulmonary embolus 9 years prior in 2007.   IMPRESSION: 1. No pulmonary embolus. 2. Congestive heart failure with large bilateral pleural effusions causing compressive atelectasis at the lung bases. 3. Atherosclerosis of the thoracic aorta with mitral and aortic annulus calcifications.   TELE: SR, a short episode of SVT with rate related BBB  TTE: 01/06/2015  Left ventricle: The cavity size was normal. Wall thickness was increased in a pattern of mild LVH. Systolic function was normal. The estimated ejection fraction was in the range of 55% to 60%. Wall motion was normal; there were no regional wall motion abnormalities. - Aortic valve: There was severe stenosis. Valve area (VTI): 0.44 cm^2. Valve area (Vmax): 0.36 cm^2. Valve area (Vmean): 0.34 cm^2. - Mitral valve: The findings are consistent with mild stenosis. There was moderate to severe regurgitation. Valve area by continuity equation (using LVOT flow): 0.79 cm^2. - Left atrium: The atrium was moderately to severely dilated. - Right atrium: The atrium was moderately to severely dilated. - Pulmonary arteries: Systolic pressure was moderately to severely increased. PA peak pressure: 55 mm Hg (S).    ASSESSMENT AND PLAN  This is a 81 y.o. female with no known history of CAD with EKG suggestive of remote anteroseptal infarct with mildly elevated troponin and symptoms and signs of heart failure with elevated BNP  Principal Problem:  Acute respiratory failure with hypoxia (HCC) Active Problems:  Essential hypertension  Aortic valve disease  Osteoarthritis of left knee  Anxiety  Acute CHF (congestive heart failure) (HCC)  Elevated troponin  Chest pain  SOB (shortness of breath)  1. Acute CHF exacerbation/acute hypoxic respiratory failure secondary to CHF, NYHA class IV, normal LVEF IV diuresis with lasix, no lasix prior to the admission  2. Multivalvular problem: Severe AS Mild MS Moderate to severe MR   The patient is clear that she  doesn't want any surgeries including TAVR. Considering her mild dementia and poor functional status I thing that it is a very reasonable option. We will treat conservatively to prevent CHF. This might be very difficult as she also has severe pulmonary hypertension and we are limited with use of vasodilators in the settings of severe AS.  Diuresis would be the key in symptoms reliving here.  We will continue iv diuresis for now. Long term she might require care at the SNF. This needs to be discussed with her grandson who is caretaker.   2. Elevated troponin - flat tend sec to CHF. I will discontinue heparin drip.  3. Hypertension, essential - control bp with MAP <90 and systolic <140  4. Pulmonary HTN - as above   Signed, Lars Masson MD, [facility] 01/07/2015

## 2015-01-07 NOTE — Evaluation (Signed)
Occupational Therapy Evaluation Patient Details Name: Natalie Arnold MRN: [id number] DOB: [birth year] Today's Date: 01/07/2015    History of Present Illness Pt. admitted 01/05/15 with worsening SOB, cough, rib pain and leg swelling.  Pt. found to have acute respiratory failure with hypoxia, CHF, likely NSTEMI.  PMH  includes polymyalgic rheumatica, aortic stenosis, osteoarthritis , aortic stenosis, PVC.     Clinical Impression   Pt admitted with above. She demonstrates the below listed deficits and will benefit from continued OT to maximize safety and independence with BADLs.  Currently pt requires mod A for ADLs and functional mobility.   She fatigues quites quickly with activity DOE 3-4/4, VSS.  She is very determined to return home and she does have 24 hour assist.   She will likely require 3in1 commode, w/c, and hospital bed at discharge, although she is somewhat resistant to the idea of this.   Will follow acutely.         Follow Up Recommendations  Home health OT;Supervision/Assistance - 24 hour    Equipment Recommendations  3 in 1 bedside comode;Wheelchair (measurements OT);Hospital bed    Recommendations for Other Services       Precautions / Restrictions Precautions Precautions: Fall      Mobility Bed Mobility Overal bed mobility: Needs Assistance Bed Mobility: Supine to Sit     Supine to sit: Mod assist     General bed mobility comments: Pt with difficulty lifting trunk from bed.  Heavily reliance on bedrails   Transfers Overall transfer level: Needs assistance   Transfers: Sit to/from Stand;Stand Pivot Transfers Sit to Stand: Mod assist Stand pivot transfers: Mod assist       General transfer comment: Pt requires assist to move fully into standing and mod A to maintain balance when standing    Balance Overall balance assessment: Needs assistance Sitting-balance support: Feet supported Sitting balance-Leahy Scale: Fair     Standing balance support:  Bilateral upper extremity supported Standing balance-Leahy Scale: Poor Standing balance comment: requires mod A                             ADL Overall ADL's : Needs assistance/impaired Eating/Feeding: Set up;Sitting   Grooming: Wash/dry hands;Wash/dry face;Oral care;Brushing hair;Supervision/safety;Sitting   Upper Body Bathing: Minimal assitance;Sitting   Lower Body Bathing: Moderate assistance;Sit to/from stand   Upper Body Dressing : Minimal assistance;Sitting   Lower Body Dressing: Moderate assistance;Sit to/from stand   Toilet Transfer: Moderate assistance;Stand-pivot;RW;BSC   Toileting- Clothing Manipulation and Hygiene: Maximal assistance;Sit to/from stand       Functional mobility during ADLs: Rolling walker;Moderate assistance General ADL Comments: Pt is insistent that she will be able to perform at her baseline once she returns home.  Long discussion with pt that she is weaker than she was PTA, and will likely require increased assistance at discharge.  Pt feels if she has her cane, she will walk fine.  Pt very irritable at times, but finally conceeded that she may need more assist when she couldn't walk more than 5' before fatiguing      Vision     Perception     Praxis      Pertinent Vitals/Pain Pain Assessment: Faces Faces Pain Scale: Hurts even more Pain Location: knees (chronic) Pain Descriptors / Indicators: Grimacing Pain Intervention(s): Monitored during session     Hand Dominance Right   Extremity/Trunk Assessment Upper Extremity Assessment Upper Extremity Assessment: Generalized weakness   Lower Extremity  Assessment Lower Extremity Assessment: Defer to PT evaluation   Cervical / Trunk Assessment Cervical / Trunk Assessment: Kyphotic   Communication Communication Communication: HOH   Cognition Arousal/Alertness: Awake/alert Behavior During Therapy: WFL for tasks assessed/performed Overall Cognitive Status: No family/caregiver  present to determine baseline cognitive functioning                     General Comments       Exercises       Shoulder Instructions      Home Living Family/patient expects to be discharged to:: Private residence   Available Help at Discharge: Family;Available 24 hours/day Type of Home: House Home Access: Stairs to enter Entergy CorporationEntrance Stairs-Number of Steps: 2         Bathroom Shower/Tub: Walk-in shower         Home Equipment: Gilmer MorCane - single point;Shower seat   Additional Comments: Pt lives with gransdon and his wife who are able to provide 24 hour assist      Prior Functioning/Environment Level of Independence: Needs assistance  Gait / Transfers Assistance Needed: ambulated with cane.  Has h/o falls per CM ADL's / Homemaking Assistance Needed: Pt reports grandson assisted her into bathtub, but she bathed and dressed herself.  Family not present to confirm   Comments: Pt reports she sits on her couch most days, but does walk to and from bathroom     OT Diagnosis: Generalized weakness;Cognitive deficits   OT Problem List: Decreased strength;Decreased activity tolerance;Impaired balance (sitting and/or standing);Decreased safety awareness;Decreased knowledge of use of DME or AE;Cardiopulmonary status limiting activity;Pain   OT Treatment/Interventions: Self-care/ADL training;DME and/or AE instruction;Therapeutic exercise;Therapeutic activities;Patient/family education;Balance training;Cognitive remediation/compensation    OT Goals(Current goals can be found in the care plan section) Acute Rehab OT Goals Patient Stated Goal: to go home  OT Goal Formulation: With patient Time For Goal Achievement: 01/21/15 Potential to Achieve Goals: Good ADL Goals Pt Will Perform Upper Body Bathing: with supervision;sitting Pt Will Perform Lower Body Bathing: with min assist;sit to/from stand Pt Will Perform Upper Body Dressing: with supervision;sitting Pt Will Perform Lower Body  Dressing: with min assist;sit to/from stand Pt Will Transfer to Toilet: with min assist;ambulating;regular height toilet;bedside commode;grab bars Pt Will Perform Toileting - Clothing Manipulation and hygiene: with min assist;sit to/from stand  OT Frequency: Min 2X/week   Barriers to D/C:            Co-evaluation              End of Session Equipment Utilized During Treatment: Rolling walker;Oxygen Nurse Communication: Mobility status  Activity Tolerance: Patient limited by fatigue Patient left: in chair;with call bell/phone within reach;with chair alarm set   Time: 1357-1451 OT Time Calculation (min): 54 min Charges:  OT Evaluation $Initial OT Evaluation Tier I: 1 Procedure OT Treatments $Self Care/Home Management : 8-22 mins $Therapeutic Activity: 23-37 mins G-Codes:    Natalie Arnold 01/07/2015, 3:05 PM

## 2015-01-07 NOTE — Progress Notes (Signed)
[facility] PROGRESS NOTE  Natalie Arnold GNF:[id number] DOB: 03/12/1923 DOA: 01/05/2015 PCP: Ginette OttoSTONEKING,HAL THOMAS, MD  Brief narrative 81 year old female with severe aortic stenosis, history of PE 2007, hypertension, anxiety, PMR, osteoarthritis of left knee Presented to the ED with 2 day history of chest pain and shortness of breath associated with dry cough. Pain was located on her right chest, constant sharp and worsened with deep breathing and coughing. In the ED patient normal vitals with  an elevated troponin of 0.21, BNP of 2380 and normal renal function. Chest x-ray showed new bilateral interstitial edema with moderate effusion and bibasilar atelectasis. Admitted to stepdown unit for possible NSTEMI and CHF exacerbation.  Assessment/Plan: Acute respiratory failure with hypoxia Secondary to acute CHF exacerbation and NSTEMI.  CT angio cheest negative for PE. On high dose IV Lasix for aggressive diureses. 2-D echo shows normal EF but with severe aortic stenosis and pulmonary artery hypertension. Discussed about this with patient and she does not want any form of surgical intervention. Continue ASA, ACEi. Monitor electrolytes on high-dose Lasix. Diuresing well with net negative balance of 4 L. Monitor strict I/O. Foley catheter was placed overnight as patient was  Confused.   NSTEMI  elevated troponin could be associated with demand ischemia with CHF and severe AS.  on IV heparin. Troponin peaked at 0.21. Patient does not want any surgical intervention of her severe ALS. Continue ASA, lipitor. Holding BB due to acute CHF. Cardiology following.  Severe aortic stenosis  Patient doesnot wish for any surgical intervention. Continue diuresis. Avoid afterlad reducing agants.   Essential HTN  low dose Amlodipine and ACEi.  Left knee osteoarthritis  prn norco.  anxiety  continue ativan prn  Diet: heart healthy  DVT prophylaxis: IV heparin   Code Status: DNR Family  Communication: none at bedside. Will update her grandson Disposition Plan: continue stepdown monitoring   Consultants:  cardiology  Procedures:  CT angio chest   2 D echo  Antibiotics:  none  HPI/Subjective: Seen and examined. Denies pain in her right chest. Reports of breathing to be slightly better. Reportedly was confused during the night requiring Foley catheter to be placed in.  Objective: Filed Vitals:   01/07/15 1112  BP: 99/58  Pulse:   Temp: 97.8 F (36.6 C)  Resp: 18    Intake/Output Summary (Last 24 hours) at 01/07/15 1124 Last data filed at 01/07/15 1000  Gross per 24 hour  Intake 343.07 ml  Output   2250 ml  Net -1906.93 ml   Filed Weights   01/06/15 0119 01/06/15 0300 01/07/15 0509  Weight: 50.803 kg (112 lb) 54 kg (119 lb 0.8 oz) 55.3 kg (121 lb 14.6 oz)    Exam:   General:   not in distress   HEENT: no pallor, JVD+, moist mucosa  chest: diminished bibasilar breath sounds  Cardiovascular: s1&s2 regular 4/6 systolic murmur  GI: soft, NT, ND, bs+  Musculoskeletal: Warm, no edema, foley +  CNS: alert and oriented x2,   Data Reviewed: Basic Metabolic Panel:  Recent Labs Lab 01/05/15 2304 01/06/15 0214 01/07/15 0350  NA 141 141 141  K 4.6 4.4 4.1  CL 105 103 98*  CO2 29 28 34*  GLUCOSE 146* 119* 111*  BUN 31* 31* 28*  CREATININE 0.94 1.01* 1.08*  CALCIUM 9.7 9.9 9.2   Liver Function Tests:  Recent Labs Lab 01/06/15 0214  AST 58*  ALT 37  ALKPHOS 65  BILITOT 0.8  PROT 7.3  ALBUMIN 4.0  No results for input(s): LIPASE, AMYLASE in the last 168 hours. No results for input(s): AMMONIA in the last 168 hours. CBC:  Recent Labs Lab 01/05/15 2304 01/06/15 0214 01/07/15 0350  WBC 7.4 7.2 8.9  NEUTROABS 4.4  --   --   HGB 10.3* 10.2* 10.4*  HCT 33.1* 32.7* 32.6*  MCV 103.4* 102.5* 103.2*  PLT 144* 133* 172   Cardiac Enzymes:  Recent Labs Lab 01/05/15 2304 01/06/15 0214 01/06/15 0713 01/06/15 1300   TROPONINI 0.21* 0.21* 0.19* 0.19*   BNP (last 3 results)  Recent Labs  01/05/15 2304  BNP 2380.1*    ProBNP (last 3 results) No results for input(s): PROBNP in the last 8760 hours.  CBG:  Recent Labs Lab 01/06/15 0722 01/07/15 0800  GLUCAP 102* 102*    Recent Results (from the past 240 hour(s))  MRSA PCR Screening     Status: None   Collection Time: 01/06/15  2:44 AM  Result Value Ref Range Status   MRSA by PCR NEGATIVE NEGATIVE Final    Comment:        The GeneXpert MRSA Assay (FDA approved for NASAL specimens only), is one component of a comprehensive MRSA colonization surveillance program. It is not intended to diagnose MRSA infection nor to guide or monitor treatment for MRSA infections.      Studies: Dg Chest 2 View  01/05/2015  CLINICAL DATA:  Pt states severe rt anteriot chest/rib pains x 2 weeks +,hx past PE, htn, non smoker, she states no known injury or fall EXAM: CHEST - 2 VIEW COMPARISON:  05/06/2012 FINDINGS: Interval development of moderate interstitial edema or infiltrates. Interval development of moderate bilateral pleural effusions, with adjacent consolidation/ atelectasis in both lung bases. Remote L2 kyphoplasty/vertebroplasty. Compression deformities of T11 and T12 as before. Atheromatous aorta. Mild cardiomegaly suspected, difficult to accurately assess due to adjacent opacities. IMPRESSION: 1. New bilateral interstitial edema, moderate effusions, and bibasilar atelectasis. Electronically Signed   By: Corlis Leak M.D.   On: 01/05/2015 23:32   Ct Angio Chest Pe W/cm &/or Wo Cm  01/06/2015  CLINICAL DATA:  Shortness of breath, acute congestive heart failure. Pleuritic chest pain. History of pulmonary embolus 9 years prior in 2007. EXAM: CT ANGIOGRAPHY CHEST WITH CONTRAST TECHNIQUE: Multidetector CT imaging of the chest was performed using the standard protocol during bolus administration of intravenous contrast. Multiplanar CT image reconstructions  and MIPs were obtained to evaluate the vascular anatomy. CONTRAST:  80mL OMNIPAQUE IOHEXOL 350 MG/ML SOLN COMPARISON:  Radiographs earlier this day.  Chest CT 11/16/2005 FINDINGS: Previous thrombus in the pulmonary arteries has resolved. There are no filling defects to suggest acute pulmonary embolus. Limited assessment of the subsegmental branches, likely secondary to diminished cardiac output. Borderline cardiomegaly with dilatation of the right atrium and ventricle and contrast refluxing into the IVC. Dense mitral and aortic annular calcifications. The thoracic aorta is normal in caliber with moderate atherosclerosis. Distal descending thoracic aorta is tortuous. Large bilateral pleural effusions with compressive atelectasis in the lower and right middle lobes. Ground-glass opacities in smooth septal thickening in the aerated lungs. Narrowing of the anterior segment of the right middle lobe with atelectasis likely secondary to compression from pleural effusion. Evaluation of the upper abdomen demonstrates large cyst in the mid right kidney, partially included, measuring at 8 cm. No evidence of acute upper abdominal abnormality. Exaggerated thoracic kyphosis. Multiple compression deformities in the lower thoracic and upper lumbar spine. Kyphoplasty within L2. Moderate to severe compression deformity of T12. The  bones are under mineralized. There is a remote sternal fracture, however new from 2007. Review of the MIP images confirms the above findings. IMPRESSION: 1. No pulmonary embolus. 2. Congestive heart failure with large bilateral pleural effusions causing compressive atelectasis at the lung bases. 3. Atherosclerosis of the thoracic aorta with mitral and aortic annulus calcifications. Electronically Signed   By: Rubye Oaks M.D.   On: 01/06/2015 02:45    Scheduled Meds: . amLODipine  2.5 mg Oral Daily  . antiseptic oral rinse  7 mL Mouth Rinse BID  . aspirin  81 mg Oral Daily  . atorvastatin  80 mg  Oral q1800  . calcium-vitamin D  2 tablet Oral Q breakfast  . cholecalciferol  2,000 Units Oral Daily  . feeding supplement (ENSURE ENLIVE)  237 mL Oral BID BM  . furosemide  40 mg Intravenous Q6H  . lisinopril  5 mg Oral Daily  . multivitamin with minerals  1 tablet Oral Daily  . sodium chloride  3 mL Intravenous Q12H   Continuous Infusions: . sodium chloride 4 mL/hr at 01/06/15 0300  . heparin 700 Units/hr (01/07/15 1610)      Time spent: 25 minutes    Natalie Arnold  [facility] Pager [phone]. If 7PM-7AM, please contact night-coverage at [email or web address], password [facility] 01/07/2015, 11:24 AM  LOS: 1 day

## 2015-01-07 NOTE — Evaluation (Signed)
Physical Therapy Evaluation Patient Details Name: Natalie CotaDorothy B Hegeman MRN: [id number] DOB: 10/06/1923 Today's Date: 01/07/2015   History of Present Illness  Pt. admitted 01/05/15 with worsening SOB, cough, rib pain and leg swelling.  Pt. found to have acute respiratory failure with hypoxia, CHF, likely NSTEMI.  PMH  includes polymyalgic rheumatica, aortic stenosis, osteoarthritis , aortic stenosis, PVC.    Clinical Impression  Pt. Presents with the above deficits and will benefit from acute PT to address the problems listed below.  Pt. Will need 24 hour care when ready for DC from acute.  She will benefit from SNF level care and therapies to return to her baseline unless family is able to provide 24 hour assist.      Follow Up Recommendations SNF;Supervision/Assistance - 24 hour ([facility]    Equipment Recommendations  Other (comment) (TBA)    Recommendations for Other Services       Precautions / Restrictions Precautions Precautions: Fall Restrictions Weight Bearing Restrictions: No      Mobility  Bed Mobility Overal bed mobility: Needs Assistance Bed Mobility: Supine to Sit     Supine to sit: Mod assist;HOB elevated     General bed mobility comments: pt. needed mod assist from elevated bed to move to sitting position and to return to supine. [facility]  Transfers Overall transfer level:  (not attempted; to safety concerns with only +1 assist presen)               General transfer comment: TBA  Ambulation/Gait                Stairs            Wheelchair Mobility    Modified Rankin (Stroke Patients Only)       Balance                                             Pertinent Vitals/Pain Pain Assessment: No/denies pain    Home Living Family/patient expects to be discharged to:: Unsure Living Arrangements: Other relatives               Additional  Comments: pt. and grandson live together.  Pt. poor historian but endorses that grandson lives in her home.  She indicates that he works outside of the home and she is home alone at times.    Prior Function Level of Independence: Independent with assistive device(s)         Comments: Limited walking in the home with cane.  Pt. reports she "holds onto things"     Hand Dominance   Dominant Hand: Right    Extremity/Trunk Assessment   Upper Extremity Assessment: Defer to OT evaluation           Lower Extremity Assessment: Generalized weakness;RLE deficits/detail RLE Deficits / Details: Pt. with painful and stiff right knee; strength grossly 3-/5 due to knee pain       Communication   Communication: HOH  Cognition Arousal/Alertness: Awake/alert Behavior During Therapy: WFL for tasks assessed/performed Overall Cognitive Status: No family/caregiver present to determine baseline cognitive functioning       Memory: Decreased short-term memory;Decreased recall of precautions              General Comments      Exercises  Assessment/Plan    PT Assessment Patient needs continued PT services  PT Diagnosis Difficulty walking;Generalized weakness;Acute pain   PT Problem List Decreased strength;Decreased range of motion;Decreased activity tolerance;Decreased balance;Decreased mobility;Decreased knowledge of use of DME;Pain  PT Treatment Interventions DME instruction;Gait training;Functional mobility training;Therapeutic activities;Therapeutic exercise;Balance training;Patient/family education   PT Goals (Current goals can be found in the Care Plan section) Acute Rehab PT Goals Patient Stated Goal: pt. did not state but was agreeable to increase her ability to walk from current level PT Goal Formulation: With patient Time For Goal Achievement: 01/14/15 Potential to Achieve Goals: Good    Frequency Min 3X/week   Barriers to discharge Decreased caregiver  support reportedly, grandson is caregiver but works out of home.  Pt. likely willl need 24 hour care due to current functional level.    Co-evaluation               End of Session Equipment Utilized During Treatment: Oxygen Activity Tolerance: Patient tolerated treatment well (limited by activity tolerance) Patient left: in bed;with bed alarm set;with call bell/phone within reach Nurse Communication: Mobility status         Time: 1610-9604 PT Time Calculation (min) (ACUTE ONLY): 19 min   Charges:   PT Evaluation $Initial PT Evaluation Tier I: 1 Procedure     PT G CodesFerman Hamming 01/07/2015, 10:15 AM Weldon Picking PT Acute Rehab Services [phone]

## 2015-01-07 NOTE — Care Management Note (Signed)
Case Management Note  Patient Details  Name: Natalie Arnold MRN: [id number] Date of Birth: 04/24/1923  Subjective/Objective:      Adm w chf, mi              Action/Plan: grandson and granda in law live w her   Expected Discharge Date:                  Expected Discharge Plan:  Home w Home Health Services  In-House Referral:     Discharge planning Services  CM Consult  Post Acute Care Choice:  Home Health Choice offered to:     DME Arranged:    DME Agency:     HH Arranged:  RN, Disease Management, PT HH Agency:  [facility]  Status of Service:     Medicare Important Message Given:    Date Medicare IM Given:    Medicare IM give by:    Date Additional Medicare IM Given:    Additional Medicare Important Message give by:     If discussed at Long Length of Stay Meetings, dates discussed:    Additional Comments: went over list of hhc agencies, no pref. Agreeable to [facility] for hhpt and hhrn. Ref to [facility]na w [facility]. They will contact fam after disch and set up appt.  Natalie Arnold, Natalie Blank T, RN 01/07/2015, 1:40 PM

## 2015-01-07 NOTE — Clinical Documentation Improvement (Signed)
Internal Medicine   Can the diagnosis of CHF be further specified? Please update your documentation within the medical record to reflect your response to this query. Do not document in BPA; add findings to progress note. Thank you   Acuity - Acute, Chronic, Acute on Chronic  Type - Systolic, Diastolic, Systolic and Diastolic Other Clinically Undetermined  Document any associated diagnoses/conditions  Supporting Information: EF 55 - 60% Being treated with IV Lasix 40mg Q6H and Zestril 5mg PO Daily  Please exercise your independent, professional judgment when responding. A specific answer is not anticipated or expected.  Thank You,  Shannie Kontos T Tineshia Becraft RN, BSN Health Information Management [place] [phone]; Cell: [phone] 

## 2015-01-08 DIAGNOSIS — I10 Essential (primary) hypertension: Secondary | ICD-10-CM

## 2015-01-08 DIAGNOSIS — I509 Heart failure, unspecified: Secondary | ICD-10-CM | POA: Diagnosis present

## 2015-01-08 DIAGNOSIS — I5033 Acute on chronic diastolic (congestive) heart failure: Secondary | ICD-10-CM

## 2015-01-08 LAB — CBC
HEMATOCRIT: 31.5 % — AB (ref 36.0–46.0)
Hemoglobin: 9.9 g/dL — ABNORMAL LOW (ref 12.0–15.0)
MCH: 32.2 pg (ref 26.0–34.0)
MCHC: 31.4 g/dL (ref 30.0–36.0)
MCV: 102.6 fL — AB (ref 78.0–100.0)
PLATELETS: 158 10*3/uL (ref 150–400)
RBC: 3.07 MIL/uL — ABNORMAL LOW (ref 3.87–5.11)
RDW: 15.5 % (ref 11.5–15.5)
WBC: 7.7 10*3/uL (ref 4.0–10.5)

## 2015-01-08 LAB — BASIC METABOLIC PANEL
Anion gap: 8 (ref 5–15)
BUN: 30 mg/dL — AB (ref 6–20)
CHLORIDE: 95 mmol/L — AB (ref 101–111)
CO2: 35 mmol/L — ABNORMAL HIGH (ref 22–32)
CREATININE: 1.05 mg/dL — AB (ref 0.44–1.00)
Calcium: 8.7 mg/dL — ABNORMAL LOW (ref 8.9–10.3)
GFR calc Af Amer: 53 mL/min — ABNORMAL LOW (ref >60)
GFR calc non Af Amer: 46 mL/min — ABNORMAL LOW (ref >60)
GLUCOSE: 101 mg/dL — AB (ref 65–99)
POTASSIUM: 3.1 mmol/L — AB (ref 3.5–5.1)
SODIUM: 138 mmol/L (ref 135–145)

## 2015-01-08 MED ORDER — FUROSEMIDE 10 MG/ML IJ SOLN: 40.0000 mg | Freq: Two times a day (BID) | INTRAMUSCULAR | Status: DC

## 2015-01-08 MED ORDER — POTASSIUM CHLORIDE CRYS ER 20 MEQ PO TBCR
40.0000 meq | EXTENDED_RELEASE_TABLET | Freq: Every day | ORAL | Status: DC
  Administered 2015-01-09: 40 meq via ORAL
  Filled 2015-01-08: qty 2

## 2015-01-08 MED ORDER — FUROSEMIDE 40 MG PO TABS: 40.0000 mg | ORAL_TABLET | Freq: Every day | ORAL | Status: DC

## 2015-01-08 MED ORDER — POTASSIUM CHLORIDE CRYS ER 20 MEQ PO TBCR
40.0000 meq | EXTENDED_RELEASE_TABLET | ORAL | Status: AC
  Administered 2015-01-08 (×2): 40 meq via ORAL
  Filled 2015-01-08 (×2): qty 2

## 2015-01-08 MED ORDER — FUROSEMIDE 40 MG PO TABS
40.0000 mg | ORAL_TABLET | Freq: Two times a day (BID) | ORAL | Status: DC
  Administered 2015-01-08 – 2015-01-09 (×2): 40 mg via ORAL
  Filled 2015-01-08 (×2): qty 1

## 2015-01-08 MED ORDER — FUROSEMIDE 10 MG/ML IJ SOLN
40.0000 mg | Freq: Once | INTRAMUSCULAR | Status: AC
  Administered 2015-01-08: 40 mg via INTRAVENOUS
  Filled 2015-01-08: qty 4

## 2015-01-08 MED ORDER — ENOXAPARIN SODIUM 30 MG/0.3ML ~~LOC~~ SOLN
30.0000 mg | SUBCUTANEOUS | Status: DC
  Administered 2015-01-08 – 2015-01-09 (×2): 30 mg via SUBCUTANEOUS
  Filled 2015-01-08 (×2): qty 0.3

## 2015-01-08 NOTE — Progress Notes (Signed)
Nutrition Follow-up  DOCUMENTATION CODES:   Non-severe (moderate) malnutrition in context of chronic illness  INTERVENTION:   -Continue Ensure Enlive po BID, each supplement provides 350 kcal and 20 grams of protein -Continue MVI daily  NUTRITION DIAGNOSIS:   Malnutrition related to chronic illness as evidenced by energy intake < 75% for > or equal to 1 month, moderate depletion of body fat, moderate depletions of muscle mass.  Ongoing  GOAL:   Patient will meet greater than or equal to 90% of their needs  Progressing  MONITOR:   PO intake, Supplement acceptance, Labs, Weight trends, Skin, I & O's  REASON FOR ASSESSMENT:   Consult Assessment of nutrition requirement/status  ASSESSMENT:   Karilyn CotaDorothy B Mcever is a 81 y.o. female with PMH of PE 2007, hypertension, anxiety, PMR, PVC, aortic stenosis, OP, OA of left knee, who presents with shortness of breath and chest pain.  Pt was sitting in recliner at time of visit. She reports appetite is "back to normal" (however, noted intake is chronically poor PTA, per assessment on 01/06/15). She reports she consumed some oatmeal for breakfast. Noted meal completion 50-100%.   Pt has Ensure Enlive supplement on bedside table. Noted pt had consumed over 75% of supplement. Pt reports she likes supplement and is amenable to continuing supplement during hospitalization. RD reinforced importance of good meal and supplement intake to promote healing. Pt expressed eagerness to return home.   Therapy following; plan is to d/c with 24 hours supervision (home health services) vs SNF.   Labs reviewed: K: 3.1 (on supplement). CBGS: 102.   Diet Order:  Diet 2 gram sodium Room service appropriate?: Yes; Fluid consistency:: Thin  Skin:  Reviewed, no issues  Last BM:  01/06/15  Height:   Ht Readings from Last 1 Encounters:  01/06/15 5\' 4"  (1.626 m)    Weight:   Wt Readings from Last 1 Encounters:  01/08/15 114 lb 8 oz (51.937 kg)     Ideal Body Weight:  54.5 kg  BMI:  Body mass index is 19.64 kg/(m^2).  Estimated Nutritional Needs:   Kcal:  1350-1550  Protein:  60-70 grams  Fluid:  1.3-1.5 L  EDUCATION NEEDS:   Education needs addressed  Evyn Kooyman A. Mayford KnifeWilliams, RD, LDN, CDE Pager: [phone] After hours Pager: [phone]

## 2015-01-08 NOTE — Progress Notes (Signed)
Patient Name: Natalie Arnold Date of Encounter: 01/08/2015  Principal Problem:   Acute respiratory failure with hypoxia [facility]) Active Problems:   Essential hypertension   Aortic valve disease   Osteoarthritis of left knee   Anxiety   Acute CHF (congestive heart failure) (HCC)   Elevated troponin   Chest pain   SOB (shortness of breath)   Malnutrition of moderate degree   Aortic stenosis, severe   Pulmonary hypertension (HCC)   Length of Stay: 2  SUBJECTIVE  The patient states that she feels better.   CURRENT MEDS . amLODipine  2.5 mg Oral Daily  . antiseptic oral rinse  7 mL Mouth Rinse BID  . aspirin  81 mg Oral Daily  . atorvastatin  80 mg Oral q1800  . calcium-vitamin D  2 tablet Oral Q breakfast  . cholecalciferol  2,000 Units Oral Daily  . enoxaparin (LOVENOX) injection  30 mg Subcutaneous Q24H  . feeding supplement (ENSURE ENLIVE)  237 mL Oral BID BM  . furosemide  40 mg Intravenous Q12H  . furosemide  40 mg Intravenous Once  . lisinopril  5 mg Oral Daily  . multivitamin with minerals  1 tablet Oral Daily  . potassium chloride  40 mEq Oral Q4H  . [START ON 01/09/2015] potassium chloride  40 mEq Oral Daily  . sodium chloride  3 mL Intravenous Q12H   OBJECTIVE  Filed Vitals:   01/07/15 1934 01/07/15 2319 01/08/15 0330 01/08/15 0730  BP: 102/62 107/63 113/68 110/68  Pulse:    68  Temp: 98.2 F (36.8 C) 98 F (36.7 C) 98.5 F (36.9 C) 98.5 F (36.9 C)  TempSrc: Oral Oral Oral Oral  Resp: 18 16 34 18  Height:      Weight:   114 lb 8 oz (51.937 kg)   SpO2: 95% 95%  96%    Intake/Output Summary (Last 24 hours) at 01/08/15 1128 Last data filed at 01/08/15 0900  Gross per 24 hour  Intake  587.7 ml  Output   1625 ml  Net -1037.3 ml   Filed Weights   01/06/15 0300 01/07/15 0509 01/08/15 0330  Weight: 119 lb 0.8 oz (54 kg) 121 lb 14.6 oz (55.3 kg) 114 lb 8 oz (51.937 kg)   PHYSICAL EXAM  General: Pleasant, NAD. Neuro: Alert and oriented X 3.  Moves all extremities spontaneously. Psych: Normal affect. HEENT:  Normal  Neck: Supple without bruits, JVD elevated up to jaws. Lungs:  Resp regular and unlabored, crackles at basis . Heart: RRR no s3, s4, 4/6 systolic and and mild diastolic murmur. Abdomen: Soft, non-tender, non-distended, BS + x 4.  Extremities: No clubbing, cyanosis or edema. DP/PT/Radials 2+ and equal bilaterally.  Accessory Clinical Findings  CBC  Recent Labs  01/05/15 2304  01/07/15 0350 01/08/15 0352  WBC 7.4  < > 8.9 7.7  NEUTROABS 4.4  --   --   --   HGB 10.3*  < > 10.4* 9.9*  HCT 33.1*  < > 32.6* 31.5*  MCV 103.4*  < > 103.2* 102.6*  PLT 144*  < > 172 158  < > = values in this interval not displayed. Basic Metabolic Panel  Recent Labs  01/07/15 0350 01/08/15 0352  NA 141 138  K 4.1 3.1*  CL 98* 95*  CO2 34* 35*  GLUCOSE 111* 101*  BUN 28* 30*  CREATININE 1.08* 1.05*  CALCIUM 9.2 8.7*   Liver Function Tests  Recent Labs  01/06/15 0214  AST  58*  ALT 37  ALKPHOS 65  BILITOT 0.8  PROT 7.3  ALBUMIN 4.0   Cardiac Enzymes  Recent Labs  01/06/15 0214 01/06/15 0713 01/06/15 1300  TROPONINI 0.21* 0.19* 0.19*    Recent Labs  01/06/15 0214  HGBA1C 5.3   Fasting Lipid Panel  Recent Labs  01/06/15 0214  CHOL 125  HDL 43  LDLCALC 62  TRIG 102  CHOLHDL 2.9   Thyroid Function Tests  Recent Labs  01/06/15 0214  TSH 7.298*   Radiology/Studies  Dg Chest 2 View  01/05/2015  CLINICAL DATA:  Pt states severe rt anteriot chest/rib pains x 2 weeks +,hx past PE, htn, non smoker, she states no known injury or fall EXAM: CHEST - 2 VIEW COMPARISON:  05/06/2012 FINDINGS: Interval development of moderate interstitial edema or infiltrates. Interval development of moderate bilateral pleural effusions, with adjacent consolidation/ atelectasis in both lung bases. Remote L2 kyphoplasty/vertebroplasty. Compression deformities of T11 and T12 as before. Atheromatous aorta. Mild  cardiomegaly suspected, difficult to accurately assess due to adjacent opacities. IMPRESSION: 1. New bilateral interstitial edema, moderate effusions, and bibasilar atelectasis. Electronically Signed   By: Corlis Leak  Hassell M.D.   On: 01/05/2015 23:32   Ct Angio Chest Pe W/cm &/or Wo Cm  01/06/2015  CLINICAL DATA:  Shortness of breath, acute congestive heart failure. Pleuritic chest pain. History of pulmonary embolus 9 years prior in 2007.  IMPRESSION: 1. No pulmonary embolus. 2. Congestive heart failure with large bilateral pleural effusions causing compressive atelectasis at the lung bases. 3. Atherosclerosis of the thoracic aorta with mitral and aortic annulus calcifications.   TELE: SR, a short episode of SVT with rate related BBB  TTE: 01/06/2015  Left ventricle: The cavity size was normal. Wall thickness was increased in a pattern of mild LVH. Systolic function was normal. The estimated ejection fraction was in the range of 55% to 60%. Wall motion was normal; there were no regional wall motion abnormalities. - Aortic valve: There was severe stenosis. Valve area (VTI): 0.44 cm^2. Valve area (Vmax): 0.36 cm^2. Valve area (Vmean): 0.34 cm^2. - Mitral valve: The findings are consistent with mild stenosis. There was moderate to severe regurgitation. Valve area by continuity equation (using LVOT flow): 0.79 cm^2. - Left atrium: The atrium was moderately to severely dilated. - Right atrium: The atrium was moderately to severely dilated. - Pulmonary arteries: Systolic pressure was moderately to severely increased. PA peak pressure: 55 mm Hg (S).    ASSESSMENT AND PLAN  This is a 81 y.o. female with no known history of CAD with EKG suggestive of remote anteroseptal infarct with mildly elevated troponin and symptoms and signs of heart failure with elevated BNP  Principal Problem:  Acute respiratory failure with hypoxia (HCC) Active Problems:  Essential hypertension   Aortic valve disease  Osteoarthritis of left knee  Anxiety  Acute CHF (congestive heart failure) (HCC)  Elevated troponin  Chest pain  SOB (shortness of breath)  1. Acute CHF exacerbation/acute hypoxic respiratory failure secondary to CHF, NYHA class IV, normal LVEF IV diuresis with lasix, no lasix prior to the admission - I will switch to PO lasix 40 mg daily, she could be discharged from cardiac standpoint  2. Multivalvular problem: Severe AS Mild MS Moderate to severe MR   The patient is clear that she doesn't want any surgeries including TAVR. Considering her mild dementia and poor functional status I thing that it is a very reasonable option. We will treat conservatively to prevent CHF. This might  be very difficult as she also has severe pulmonary hypertension and we are limited with use of vasodilators in the settings of severe AS.  Diuresis would be the key in symptoms reliving here.  We will continue iv diuresis for now. Long term she might require care at the SNF. This needs to be discussed with her grandson who is caretaker.   2. Elevated troponin - flat tend sec to CHF. I will discontinue heparin drip.  3. Hypertension, essential - control bp with MAP <90 and systolic <140  4. Pulmonary HTN - as above   Signed, Lars Masson MD, [facility] 01/08/2015

## 2015-01-08 NOTE — Progress Notes (Signed)
Report called to 3E receiving nurse Martie LeeSabrina, RN. Pt being moved to 3E16, grandson Deno EtienneBrian Urbach updated.

## 2015-01-08 NOTE — Progress Notes (Signed)
[facility] PROGRESS NOTE  Natalie Arnold VZD:[id number] DOB: 1923-03-17 DOA: 01/05/2015 PCP: Ginette Otto, MD  Brief narrative 81 year old female with severe aortic stenosis, history of PE 2007, hypertension, anxiety, PMR, osteoarthritis of left knee Presented to the ED with 2 day history of chest pain and shortness of breath associated with dry cough. Pain was located on her right chest, constant sharp and worsened with deep breathing and coughing. In the ED patient normal vitals with  an elevated troponin of 0.21, BNP of 2380 and normal renal function. Chest x-ray showed new bilateral interstitial edema with moderate effusion and bibasilar atelectasis. Admitted to stepdown unit for possible NSTEMI and CHF exacerbation.  Assessment/Plan: Acute respiratory failure with hypoxia Secondary to acute CHF exacerbation and NSTEMI.  CT angio cheest negative for PE. On high dose IV Lasix for aggressive diureses. 2-D echo shows normal EF but with severe aortic stenosis and pulmonary artery hypertension. Discussed about this with patient and she does not want any form of surgical intervention. Continue ASA, ACEi. Replenish low k.  Diuresing well with net negative balance of 5.2 L. Monitor strict I/O. Foley catheter was placed as patient was confused. Reduced lasix dose to 40 mg IV  bid.   NSTEMI  elevated troponin could be associated with demand ischemia with CHF and severe AS.  Placed on IV heparin on admission and now discontinued.. Troponin peaked at 0.21. Patient does not want any surgical intervention of her severe ALS. Continue ASA, lipitor. Holding BB due to acute CHF. Cardiology following.  Severe aortic stenosis  Patient doesnot wish for any surgical intervention. Continue diuresis. Avoid afterlad reducing agants.  Hypokalemia  replenish and add daily kcl supplement.  Essential HTN  low dose Amlodipine and ACEi.  Left knee osteoarthritis  prn norco.  anxiety  continue  ativan prn  Diet: heart healthy  DVT prophylaxis: sq lovenox   Code Status: DNR Family Communication: none at bedside. Called  her grandson and left a message.  Disposition Plan: transfer to tele.PT recommends SNF vs home with full supervision.   As per CM , pt has 24/7 care at home and family do not wish for SNF. Will arrange [facility] and PT upon d/c . Possibly discharge in 48-72 hrs.   Consultants:  cardiology  Procedures:  CT angio chest   2 D echo  Antibiotics:  none  HPI/Subjective: Seen and examined. Feels tired. SOB improved.  Objective: Filed Vitals:   01/08/15 0730  BP: 110/68  Pulse: 68  Temp: 98.5 F (36.9 C)  Resp: 18    Intake/Output Summary (Last 24 hours) at 01/08/15 0840 Last data filed at 01/08/15 0600  Gross per 24 hour  Intake  368.7 ml  Output   1675 ml  Net -1306.3 ml   Filed Weights   01/06/15 0300 01/07/15 0509 01/08/15 0330  Weight: 54 kg (119 lb 0.8 oz) 55.3 kg (121 lb 14.6 oz) 51.937 kg (114 lb 8 oz)    Exam:   General:   not in distress   HEENT: no pallor, mild JVD, moist mucosa  chest: Clear bibasilar breath sounds  Cardiovascular: s1&s2 regular 4/6 systolic murmur  GI: soft, NT, ND, bs+  Musculoskeletal: Warm, no edema, foley +  CNS: alert and oriented x3,   Data Reviewed: Basic Metabolic Panel:  Recent Labs Lab 01/05/15 2304 01/06/15 0214 01/07/15 0350 01/08/15 0352  NA 141 141 141 138  K 4.6 4.4 4.1 3.1*  CL 105 103 98* 95*  CO2 29 28 34*  35*  GLUCOSE 146* 119* 111* 101*  BUN 31* 31* 28* 30*  CREATININE 0.94 1.01* 1.08* 1.05*  CALCIUM 9.7 9.9 9.2 8.7*   Liver Function Tests:  Recent Labs Lab 01/06/15 0214  AST 58*  ALT 37  ALKPHOS 65  BILITOT 0.8  PROT 7.3  ALBUMIN 4.0   No results for input(s): LIPASE, AMYLASE in the last 168 hours. No results for input(s): AMMONIA in the last 168 hours. CBC:  Recent Labs Lab 01/05/15 2304 01/06/15 0214 01/07/15 0350 01/08/15 0352  WBC 7.4 7.2 8.9  7.7  NEUTROABS 4.4  --   --   --   HGB 10.3* 10.2* 10.4* 9.9*  HCT 33.1* 32.7* 32.6* 31.5*  MCV 103.4* 102.5* 103.2* 102.6*  PLT 144* 133* 172 158   Cardiac Enzymes:  Recent Labs Lab 01/05/15 2304 01/06/15 0214 01/06/15 0713 01/06/15 1300  TROPONINI 0.21* 0.21* 0.19* 0.19*   BNP (last 3 results)  Recent Labs  01/05/15 2304  BNP 2380.1*    ProBNP (last 3 results) No results for input(s): PROBNP in the last 8760 hours.  CBG:  Recent Labs Lab 01/06/15 0722 01/07/15 0800  GLUCAP 102* 102*    Recent Results (from the past 240 hour(s))  MRSA PCR Screening     Status: None   Collection Time: 01/06/15  2:44 AM  Result Value Ref Range Status   MRSA by PCR NEGATIVE NEGATIVE Final    Comment:        The GeneXpert MRSA Assay (FDA approved for NASAL specimens only), is one component of a comprehensive MRSA colonization surveillance program. It is not intended to diagnose MRSA infection nor to guide or monitor treatment for MRSA infections.      Studies: No results found.  Scheduled Meds: . amLODipine  2.5 mg Oral Daily  . antiseptic oral rinse  7 mL Mouth Rinse BID  . aspirin  81 mg Oral Daily  . atorvastatin  80 mg Oral q1800  . calcium-vitamin D  2 tablet Oral Q breakfast  . cholecalciferol  2,000 Units Oral Daily  . feeding supplement (ENSURE ENLIVE)  237 mL Oral BID BM  . furosemide  40 mg Intravenous Q6H  . lisinopril  5 mg Oral Daily  . multivitamin with minerals  1 tablet Oral Daily  . potassium chloride  40 mEq Oral Q4H  . [START ON 01/09/2015] potassium chloride  40 mEq Oral Daily  . sodium chloride  3 mL Intravenous Q12H   Continuous Infusions: . sodium chloride 4 mL/hr at 01/06/15 0300      Time spent: 25 minutes    [name]  [facility] Pager [phone]. If 7PM-7AM, please contact night-coverage at [email or web address], password [email or web address] 01/08/2015, 8:40 AM  LOS: 2 days

## 2015-01-08 NOTE — Progress Notes (Signed)
Received [facility], pt arrived on unit in no acute distress.  Notified CCMD.  Pt oriented to room and equipment.  Pt educated on Fall Prevention Plan.  Ht and weight documented.

## 2015-01-08 NOTE — Progress Notes (Signed)
Physical Therapy Treatment Patient Details Name: Natalie Arnold MRN: [id number] DOB: 01/25/1924 Today's Date: 01/08/2015    History of Present Illness Pt. admitted 01/05/15 with worsening SOB, cough, rib pain and leg swelling.  Pt. found to have acute respiratory failure with hypoxia, CHF, likely NSTEMI.  PMH  includes polymyalgic rheumatica, aortic stenosis, osteoarthritis , aortic stenosis, PVC.      PT Comments    Patient progressing slowly towards PT goals. Tolerated gait training today with Min A for balance/safety. Decreased endurance and activity tolerance noted. Fatigues. Eager to return home. Pt and family refusing ST SNF so discharge recommendation updated to home with 24/7 S and HHPT even though pt would benefit from PT 5 days/week at rehab. Will follow acutely per current POC.   Follow Up Recommendations  Supervision/Assistance - 24 hour;Home health PT     Equipment Recommendations  None recommended by PT    Recommendations for Other Services OT consult     Precautions / Restrictions Precautions Precautions: Fall Restrictions Weight Bearing Restrictions: No    Mobility  Bed Mobility               General bed mobility comments: Sitting in chair upon PT arrival.   Transfers Overall transfer level: Needs assistance Equipment used: Pushed w/c Transfers: Sit to/from Stand Sit to Stand: Min assist;Min guard         General transfer comment: Initially Min A to boost from chair progressing to Min guard assist wtih cues for hand placement.   Ambulation/Gait Ambulation/Gait assistance: Min assist Ambulation Distance (Feet): 60 Feet (+50') Assistive device:  (pushed w/c) Gait Pattern/deviations: Step-through pattern;Decreased stride length;Staggering left;Staggering right;Drifts right/left Gait velocity: decreased   General Gait Details: Slow, unsteady gait with knee instability noted. 1 seated rest break. Coughing.   Stairs            Wheelchair  Mobility    Modified Rankin (Stroke Patients Only)       Balance Overall balance assessment: Needs assistance Sitting-balance support: Feet supported;No upper extremity supported Sitting balance-Leahy Scale: Fair Sitting balance - Comments: Fatigues quickly in trunk.   Standing balance support: During functional activity;Bilateral upper extremity supported Standing balance-Leahy Scale: Poor Standing balance comment: Relient on UE for support.                     Cognition Arousal/Alertness: Awake/alert Behavior During Therapy: WFL for tasks assessed/performed Overall Cognitive Status: No family/caregiver present to determine baseline cognitive functioning                      Exercises General Exercises - Lower Extremity Ankle Circles/Pumps: Both;15 reps;Seated Long Arc Quad: Both;15 reps;Seated Heel Raises: Both;15 reps;Seated    General Comments General comments (skin integrity, edema, etc.): Sitting BP pre ambulation 103/78, HR 76 bpm; BP post ambulation 119/67, HR 79 bpm Sp02 stayed >92% on 2L/min 02      Pertinent Vitals/Pain Pain Assessment: No/denies pain    Home Living                      Prior Function            PT Goals (current goals can now be found in the care plan section) Progress towards PT goals: Progressing toward goals    Frequency  Min 3X/week    PT Plan Discharge plan needs to be updated    Co-evaluation  End of Session Equipment Utilized During Treatment: Oxygen;Gait belt Activity Tolerance: Patient tolerated treatment well Patient left: in chair;with call bell/phone within reach     Time: 1610-9604 PT Time Calculation (min) (ACUTE ONLY): 27 min  Charges:  $Gait Training: 23-37 mins                    G Codes:      Makaio Mach A Cuinn Westerhold 01/08/2015, 2:49 PM Mylo Red, PT, DPT [phone]

## 2015-01-09 DIAGNOSIS — I248 Other forms of acute ischemic heart disease: Secondary | ICD-10-CM

## 2015-01-09 DIAGNOSIS — E876 Hypokalemia: Secondary | ICD-10-CM

## 2015-01-09 DIAGNOSIS — D539 Nutritional anemia, unspecified: Secondary | ICD-10-CM

## 2015-01-09 LAB — BASIC METABOLIC PANEL
Anion gap: 8 (ref 5–15)
BUN: 39 mg/dL — ABNORMAL HIGH (ref 6–20)
CALCIUM: 8.5 mg/dL — AB (ref 8.9–10.3)
CO2: 32 mmol/L (ref 22–32)
CREATININE: 1.28 mg/dL — AB (ref 0.44–1.00)
Chloride: 98 mmol/L — ABNORMAL LOW (ref 101–111)
GFR calc non Af Amer: 36 mL/min — ABNORMAL LOW (ref >60)
GFR, EST AFRICAN AMERICAN: 42 mL/min — AB (ref >60)
Glucose, Bld: 113 mg/dL — ABNORMAL HIGH (ref 65–99)
Potassium: 4.5 mmol/L (ref 3.5–5.1)
Sodium: 138 mmol/L (ref 135–145)

## 2015-01-09 LAB — GLUCOSE, CAPILLARY: Glucose-Capillary: 129 mg/dL — ABNORMAL HIGH (ref 65–99)

## 2015-01-09 MED ORDER — ENSURE ENLIVE PO LIQD: 237.0000 mL | Freq: Two times a day (BID) | ORAL | Status: AC

## 2015-01-09 MED ORDER — FUROSEMIDE 40 MG PO TABS: 40.0000 mg | ORAL_TABLET | Freq: Every day | ORAL | Status: DC

## 2015-01-09 MED ORDER — NITROGLYCERIN 0.4 MG SL SUBL: 0.4000 mg | SUBLINGUAL_TABLET | SUBLINGUAL | Status: DC | PRN

## 2015-01-09 MED ORDER — LISINOPRIL 5 MG PO TABS: 5.0000 mg | ORAL_TABLET | Freq: Every day | ORAL | Status: AC

## 2015-01-09 MED ORDER — ASPIRIN 81 MG PO CHEW: 81.0000 mg | CHEWABLE_TABLET | Freq: Every day | ORAL | Status: AC

## 2015-01-09 MED ORDER — FUROSEMIDE 40 MG PO TABS: 40.0000 mg | ORAL_TABLET | Freq: Two times a day (BID) | ORAL | Status: DC

## 2015-01-09 NOTE — Progress Notes (Signed)
SATURATION QUALIFICATIONS: (This note is used to comply with regulatory documentation for home oxygen)  Patient Saturations on Room Air at Rest = 91%  Patient Saturations on Room Air while Ambulating = 86%  Patient Saturations on 2 Liters of oxygen while Ambulating = 94%  Please briefly explain why patient needs home oxygen: C/O SOB and desats while ambulated less than 100 feet on room.

## 2015-01-09 NOTE — Discharge Summary (Addendum)
Physician Discharge Summary  Natalie Arnold JXB:[id number] DOB: 1923/04/19 DOA: 01/05/2015  PCP: Ginette Otto, MD  Admit date: 01/05/2015 Discharge date: 01/09/2015  Time spent: 35 minutes  Recommendations for Outpatient Follow-up:  1. Discharge home with outpatient PCP follow-up in 1 week and cardiology in 4 weeks. Arrange for home health RN and PT. Heart failure home health instructions provided. 2. Patient meets criteria for home O2 requirement (2 L nasal cannula with ambulation) and will be arranged upon discharge.   Discharge Diagnoses:  Principal Problem:   Acute respiratory failure with hypoxia (HCC)   Active Problems:   Acute on chronic congestive heart failure (HCC)   Essential hypertension   Aortic valve disease   Osteoarthritis of left knee   Anxiety   Elevated troponin   Chest pain   Malnutrition of moderate degree   Aortic stenosis, severe   Pulmonary hypertension (HCC)   Demand ischemia (HCC)   Hypokalemia  Discharge weight:  51.98 KG (114 pound)  Discharge Condition: Guarded  Diet recommendation: Heart healthy  Filed Weights   01/08/15 0330 01/08/15 1742 01/09/15 0634  Weight: 51.937 kg (114 lb 8 oz) 52.3 kg (115 lb 4.8 oz) 51.98 kg (114 lb 9.5 oz)    History of present illness:  Please refer to admission H&P for details, in brief,81 year old female with severe aortic stenosis, history of PE 2007, hypertension, anxiety, PMR, osteoarthritis of left knee Presented to the ED with 2 day history of chest pain and shortness of breath associated with dry cough. Pain was located on her right chest, constant sharp and worsened with deep breathing and coughing. In the ED patient normal vitals with an elevated troponin of 0.21, BNP of 2380 and normal renal function. Chest x-ray showed new bilateral interstitial edema with moderate effusion and bibasilar atelectasis. Admitted to stepdown unit for CHF exacerbation and demand ischemia.   Hospital Course:   Acute respiratory failure with hypoxia Secondary to acute on chronic diastolic CHF exacerbation .. CT angio cheest negative for PE. -Placed on high dose IV Lasix for aggressive diureses. 2-D echo shows normal EF but with severe aortic stenosis and pulmonary artery hypertension. Discussed about this with patient and she does not want any form of surgical intervention. Added low dose aspirin and ACE inhibitor. Diuresing well with net negative balance of 5 L.  -Placed on Lasix 40 mg daily and will be discharged on this dose. Patient is clinically stable and appears back to her baseline. She is at high risk for having CHF symptoms and rehospitalization. She will be discharged on aspirin, Lasix 40 mg by mouth daily ,low-dose ACE inhibitor and sublingual nitrate as needed. Her blood pressure remains stable can add low-dose beta blocker. -PT recommended home health with supervision versus skilled nursing facility. Patient and her grandson who lives with her refused skilled nursing facility and wished to go home with services. Home health arranged.  Elevated troponin associated with demand ischemia. -In the setting of CHF and severe aortic stenosis. - Placed on IV heparin on admission and now discontinued.. Troponin peaked at 0.21. Patient does not want any surgical intervention of her severe AS. Continue ASA. Would benefit from adding BB as outpt if BP remains stable.  Cardiology consult appreciated.  Severe aortic stenosis  Patient doesnot wish for any surgical intervention. Continue diuresis. Avoid afterlad reducing agants.  Hypokalemia replenish and add daily kcl supplement.  Essential HTN low dose Amlodipine and ACEi.  Left knee osteoarthritis Continue when necessary Norco as needed  anxiety  continue ativan prn  Moderate protein calorie malnutrition Added supplement.       Code Status: DNR Family Communication: Spoke with grandson Arlys JohnBrian in detail on the phone. Given clear  instructions on heart failure symptoms. Disposition Plan: Discharge home with home health RN and PT.   Consultants:  cardiology  Procedures:  CT angio chest  2 D echo  Antibiotics:  none    Discharge Exam: Filed Vitals:   01/09/15 0634  BP: 106/69  Pulse: 76  Temp: 98.2 F (36.8 C)  Resp: 19     General: not in distress  HEENT: no pallor, mild JVD, moist mucosa  chest: Clear bibasilar breath sounds  Cardiovascular: s1&s2 regular 4/6 systolic murmur  GI: soft, NT, ND, bs+  Musculoskeletal: Warm, no edema,  CNS: alert and oriented x3,  Discharge Instructions    Current Discharge Medication List    START taking these medications   Details  aspirin 81 MG chewable tablet Chew 1 tablet (81 mg total) by mouth daily. Qty: 30 tablet, Refills: 0    feeding supplement, ENSURE ENLIVE, (ENSURE ENLIVE) LIQD Take 237 mLs by mouth 2 (two) times daily between meals. Qty: 237 mL, Refills: 12    furosemide (LASIX) 40 MG tablet Take 1 tablet (40 mg total) by mouth daily. Qty: 30 tablet, Refills: 0    lisinopril (PRINIVIL,ZESTRIL) 5 MG tablet Take 1 tablet (5 mg total) by mouth daily. Qty: 30 tablet, Refills: 0    nitroGLYCERIN (NITROSTAT) 0.4 MG SL tablet Place 1 tablet (0.4 mg total) under the tongue every 5 (five) minutes as needed for chest pain. Qty: 30 tablet, Refills: 3      CONTINUE these medications which have NOT CHANGED   Details  Calcium Carbonate-Vitamin D (CALCIUM 600+D) 600-400 MG-UNIT per tablet Take 2 tablets by mouth daily.     cholecalciferol (VITAMIN D) 1000 UNITS tablet Take 2,000 Units by mouth daily.     HYDROcodone-acetaminophen (NORCO/VICODIN) 5-325 MG per tablet Take 1 tablet by mouth every 6 (six) hours as needed for moderate pain.    LORazepam (ATIVAN) 1 MG tablet Take 0.5 mg by mouth 2 (two) times daily as needed for anxiety.     magnesium hydroxide (MILK OF MAGNESIA) 400 MG/5ML suspension Take 30 mLs by mouth daily as needed  for mild constipation.    meclizine (ANTIVERT) 12.5 MG tablet Take 12.5 mg by mouth as needed for dizziness.    Multiple Vitamin (MULTIVITAMIN) tablet Take 1 tablet by mouth daily.    sennosides-docusate sodium (SENOKOT-S) 8.6-50 MG tablet Take 1 tablet by mouth daily as needed for constipation.      STOP taking these medications     amLODipine (NORVASC) 2.5 MG tablet        Allergies  Allergen Reactions  . Ultracet [Tramadol-Acetaminophen] Other (See Comments)    confusion  . Penicillins Rash   Follow-up Information    Follow up with [facility].   Why:  nse will call after disch to set up appt for hhrn and hhpt   Contact information:   [address] [place] [address] [phone]       Follow up with Ginette OttoSTONEKING,HAL THOMAS, MD. Schedule an appointment as soon as possible for a visit in 1 week.   Specialty:  Internal Medicine   Contact information:   [address] [place] [phone]       Follow up with Lars MassonNELSON, KATARINA H, MD. Schedule an appointment as soon  as possible for a visit in 4 weeks.   Specialty:  Cardiology   Contact information:   [address] [place] [address] [phone]        The results of significant diagnostics from this hospitalization (including imaging, microbiology, ancillary and laboratory) are listed below for reference.    Significant Diagnostic Studies: Dg Chest 2 View  01/05/2015  CLINICAL DATA:  Pt states severe rt anteriot chest/rib pains x 2 weeks +,hx past PE, htn, non smoker, she states no known injury or fall EXAM: CHEST - 2 VIEW COMPARISON:  05/06/2012 FINDINGS: Interval development of moderate interstitial edema or infiltrates. Interval development of moderate bilateral pleural effusions, with adjacent consolidation/ atelectasis in both lung bases. Remote L2 kyphoplasty/vertebroplasty. Compression deformities of T11 and T12 as before. Atheromatous  aorta. Mild cardiomegaly suspected, difficult to accurately assess due to adjacent opacities. IMPRESSION: 1. New bilateral interstitial edema, moderate effusions, and bibasilar atelectasis. Electronically Signed   By: Corlis Leak M.D.   On: 01/05/2015 23:32   Ct Angio Chest Pe W/cm &/or Wo Cm  01/06/2015  CLINICAL DATA:  Shortness of breath, acute congestive heart failure. Pleuritic chest pain. History of pulmonary embolus 9 years prior in 2007. EXAM: CT ANGIOGRAPHY CHEST WITH CONTRAST TECHNIQUE: Multidetector CT imaging of the chest was performed using the standard protocol during bolus administration of intravenous contrast. Multiplanar CT image reconstructions and MIPs were obtained to evaluate the vascular anatomy. CONTRAST:  80mL OMNIPAQUE IOHEXOL 350 MG/ML SOLN COMPARISON:  Radiographs earlier this day.  Chest CT 11/16/2005 FINDINGS: Previous thrombus in the pulmonary arteries has resolved. There are no filling defects to suggest acute pulmonary embolus. Limited assessment of the subsegmental branches, likely secondary to diminished cardiac output. Borderline cardiomegaly with dilatation of the right atrium and ventricle and contrast refluxing into the IVC. Dense mitral and aortic annular calcifications. The thoracic aorta is normal in caliber with moderate atherosclerosis. Distal descending thoracic aorta is tortuous. Large bilateral pleural effusions with compressive atelectasis in the lower and right middle lobes. Ground-glass opacities in smooth septal thickening in the aerated lungs. Narrowing of the anterior segment of the right middle lobe with atelectasis likely secondary to compression from pleural effusion. Evaluation of the upper abdomen demonstrates large cyst in the mid right kidney, partially included, measuring at 8 cm. No evidence of acute upper abdominal abnormality. Exaggerated thoracic kyphosis. Multiple compression deformities in the lower thoracic and upper lumbar spine. Kyphoplasty  within L2. Moderate to severe compression deformity of T12. The bones are under mineralized. There is a remote sternal fracture, however new from 2007. Review of the MIP images confirms the above findings. IMPRESSION: 1. No pulmonary embolus. 2. Congestive heart failure with large bilateral pleural effusions causing compressive atelectasis at the lung bases. 3. Atherosclerosis of the thoracic aorta with mitral and aortic annulus calcifications. Electronically Signed   By: Rubye Oaks M.D.   On: 01/06/2015 02:45    Microbiology: Recent Results (from the past 240 hour(s))  MRSA PCR Screening     Status: None   Collection Time: 01/06/15  2:44 AM  Result Value Ref Range Status   MRSA by PCR NEGATIVE NEGATIVE Final    Comment:        The GeneXpert MRSA Assay (FDA approved for NASAL specimens only), is one component of a comprehensive MRSA colonization surveillance program. It is not intended to diagnose MRSA infection nor to guide or monitor treatment for MRSA infections.      Labs: Basic Metabolic  Panel:  Recent Labs Lab 01/05/15 2304 01/06/15 0214 01/07/15 0350 01/08/15 0352 01/09/15 0321  NA 141 141 141 138 138  K 4.6 4.4 4.1 3.1* 4.5  CL 105 103 98* 95* 98*  CO2 29 28 34* 35* 32  GLUCOSE 146* 119* 111* 101* 113*  BUN 31* 31* 28* 30* 39*  CREATININE 0.94 1.01* 1.08* 1.05* 1.28*  CALCIUM 9.7 9.9 9.2 8.7* 8.5*   Liver Function Tests:  Recent Labs Lab 01/06/15 0214  AST 58*  ALT 37  ALKPHOS 65  BILITOT 0.8  PROT 7.3  ALBUMIN 4.0   No results for input(s): LIPASE, AMYLASE in the last 168 hours. No results for input(s): AMMONIA in the last 168 hours. CBC:  Recent Labs Lab 01/05/15 2304 01/06/15 0214 01/07/15 0350 01/08/15 0352  WBC 7.4 7.2 8.9 7.7  NEUTROABS 4.4  --   --   --   HGB 10.3* 10.2* 10.4* 9.9*  HCT 33.1* 32.7* 32.6* 31.5*  MCV 103.4* 102.5* 103.2* 102.6*  PLT 144* 133* 172 158   Cardiac Enzymes:  Recent Labs Lab 01/05/15 2304  01/06/15 0214 01/06/15 0713 01/06/15 1300  TROPONINI 0.21* 0.21* 0.19* 0.19*   BNP: BNP (last 3 results)  Recent Labs  01/05/15 2304  BNP 2380.1*    ProBNP (last 3 results) No results for input(s): PROBNP in the last 8760 hours.  CBG:  Recent Labs Lab 01/06/15 0722 01/07/15 0800  GLUCAP 102* 102*       Signed:  Amirah Goerke  [facility] 01/09/2015, 9:05 AM

## 2015-01-09 NOTE — Care Management Note (Signed)
Case Management Note  Patient Details  Name: JERRIYAH LOUIS MRN: [id number] Date of Birth: [birth year]  Subjective/Objective:                  Acute respiratory failure with hypoxia Action/Plan: Discharge planning Expected Discharge Date:  01/09/15               Expected Discharge Plan:  [place]  In-House Referral:     Discharge planning Services  CM Consult  Post Acute Care Choice:  Home Health Choice offered to:  Patient  DME Arranged:    DME Agency:     HH Arranged:  RN, Disease Management, PT [place] Agency:  [place]  Status of Service:  In process, will continue to follow  Medicare Important Message Given:    Date Medicare IM Given:    Medicare IM give by:    Date Additional Medicare IM Given:    Additional Medicare Important Message give by:     If discussed at [place] of Stay Meetings, dates discussed:    Additional Comments: CM met with pt to confirm discharge plan.  Pt lives with grandson and his girlfriend.  Pt states [facility] is fine to render HHRN/PT.  Referral called to [facility] rep, tiffany.  CM is waiting for pulmonary saturation result and if needed, PULMONARY SATURATION NOTE AND ORDER FOR HOME OXYGEN.   Will continue to follow for results. Dellie Catholic, RN 01/09/2015, 10:32 AM

## 2015-01-09 NOTE — Progress Notes (Signed)
CSW weekend handoff received to assist patient/family with SNF placement.  DC plan has changed however and patient will return home with grandson Deno EtienneBrian Mccallum. CSW spoke to [place] who indicated that he and his girlfriend have been caring for patient at home for many years and are fine with her returning home. They would like home health services and [facility] as long as it's covered by her insurance.  Arlys JohnBrian states that they have all necessary DME at home for patient. He plans to pick up patient today after 12:00 and he was instructed to notify unit RN when he enters the unit.  CSW notified weekend RNCM- Freddy JakschSarah Jeffries and unit RN- Paulette of above.  No further CSW needs identified. CSW signing off.  Lorri Frederickonna T. Jaci LazierCrowder, KentuckyLCSW [phone]

## 2015-01-09 NOTE — Discharge Instructions (Signed)

## 2015-01-09 NOTE — Progress Notes (Signed)
Subjective:  Says that she is going home today.  No complaints of shortness of breath or chest pain.  Objective:  Vital Signs in the last 24 hours: BP 104/59 mmHg  Pulse 76  Temp(Src) 98.2 F (36.8 C) (Oral)  Resp 19  Ht 5\' 7"  (1.702 m)  Wt 51.98 kg (114 lb 9.5 oz)  BMI 17.94 kg/m2  SpO2 99%  Physical Exam: Elderly female currently in no acute distress Lungs:  Clear  Cardiac:  Regular rhythm, normal S1 and S2, no S3, harsh 3 to 4/6 systolic murmur at aortic area and left sternal border Abdomen:  Soft, nontender, no masses Extremities:  No edema present  Intake/Output from previous day: 11/18 0701 - 11/19 0700 In: 1243.5 [P.O.:1015; I.V.:228.5] Out: 1600 [Urine:1600] Weight Filed Weights   01/08/15 0330 01/08/15 1742 01/09/15 0634  Weight: 51.937 kg (114 lb 8 oz) 52.3 kg (115 lb 4.8 oz) 51.98 kg (114 lb 9.5 oz)    Lab Results: Basic Metabolic Panel:  Recent Labs  16/11/9609/18/16 0352 01/09/15 0321  NA 138 138  K 3.1* 4.5  CL 95* 98*  CO2 35* 32  GLUCOSE 101* 113*  BUN 30* 39*  CREATININE 1.05* 1.28*    CBC:  Recent Labs  01/07/15 0350 01/08/15 0352  WBC 8.9 7.7  HGB 10.4* 9.9*  HCT 32.6* 31.5*  MCV 103.2* 102.6*  PLT 172 158    BNP    Component Value Date/Time   BNP 2380.1* 01/05/2015 2304   Assessment/Plan:  1.  Acute on chronic systolic heart failure clinically improves 2.  Severe aortic stenosis 3.  Severe pulmonary hypertension 4.  Moderate severe mitral regurgitation  Recommendations:  She only wants conservative therapy and is being treated conservatively with a low-dose of diuretics.  She is due to be discharged home today which is reasonable.     Darden PalmerW. Spencer Patrisha Hausmann, Jr.  MD [facility]  01/09/2015, 11:06 AM

## 2015-08-04 ENCOUNTER — Emergency Department (HOSPITAL_COMMUNITY): Payer: Medicare Other

## 2015-08-04 ENCOUNTER — Encounter (HOSPITAL_COMMUNITY): Payer: Self-pay

## 2015-08-04 ENCOUNTER — Observation Stay (HOSPITAL_COMMUNITY)
Admission: EM | Admit: 2015-08-04 | Discharge: 2015-08-06 | Disposition: A | Discharge status: Home / Self Care | Payer: Medicare Other | Attending: Internal Medicine | Admitting: Internal Medicine

## 2015-08-04 DIAGNOSIS — I5033 Acute on chronic diastolic (congestive) heart failure: Secondary | ICD-10-CM

## 2015-08-04 DIAGNOSIS — D539 Nutritional anemia, unspecified: Secondary | ICD-10-CM | POA: Diagnosis present

## 2015-08-04 DIAGNOSIS — S8002XA Contusion of left knee, initial encounter: Secondary | ICD-10-CM | POA: Insufficient documentation

## 2015-08-04 DIAGNOSIS — S60221A Contusion of right hand, initial encounter: Secondary | ICD-10-CM | POA: Diagnosis not present

## 2015-08-04 DIAGNOSIS — Z7982 Long term (current) use of aspirin: Secondary | ICD-10-CM | POA: Insufficient documentation

## 2015-08-04 DIAGNOSIS — F0390 Unspecified dementia without behavioral disturbance: Secondary | ICD-10-CM | POA: Insufficient documentation

## 2015-08-04 DIAGNOSIS — I509 Heart failure, unspecified: Secondary | ICD-10-CM

## 2015-08-04 DIAGNOSIS — N3000 Acute cystitis without hematuria: Secondary | ICD-10-CM | POA: Diagnosis not present

## 2015-08-04 DIAGNOSIS — Y939 Activity, unspecified: Secondary | ICD-10-CM | POA: Diagnosis not present

## 2015-08-04 DIAGNOSIS — Z79899 Other long term (current) drug therapy: Secondary | ICD-10-CM | POA: Diagnosis not present

## 2015-08-04 DIAGNOSIS — Y929 Unspecified place or not applicable: Secondary | ICD-10-CM | POA: Insufficient documentation

## 2015-08-04 DIAGNOSIS — Y999 Unspecified external cause status: Secondary | ICD-10-CM | POA: Diagnosis not present

## 2015-08-04 DIAGNOSIS — Y92009 Unspecified place in unspecified non-institutional (private) residence as the place of occurrence of the external cause: Secondary | ICD-10-CM | POA: Diagnosis present

## 2015-08-04 DIAGNOSIS — N390 Urinary tract infection, site not specified: Secondary | ICD-10-CM | POA: Diagnosis present

## 2015-08-04 DIAGNOSIS — S6991XA Unspecified injury of right wrist, hand and finger(s), initial encounter: Secondary | ICD-10-CM | POA: Diagnosis present

## 2015-08-04 DIAGNOSIS — W19XXXA Unspecified fall, initial encounter: Secondary | ICD-10-CM | POA: Diagnosis not present

## 2015-08-04 DIAGNOSIS — M1712 Unilateral primary osteoarthritis, left knee: Secondary | ICD-10-CM | POA: Diagnosis not present

## 2015-08-04 DIAGNOSIS — Y92099 Unspecified place in other non-institutional residence as the place of occurrence of the external cause: Secondary | ICD-10-CM

## 2015-08-04 DIAGNOSIS — I10 Essential (primary) hypertension: Secondary | ICD-10-CM | POA: Insufficient documentation

## 2015-08-04 DIAGNOSIS — I5031 Acute diastolic (congestive) heart failure: Secondary | ICD-10-CM | POA: Insufficient documentation

## 2015-08-04 DIAGNOSIS — S8001XA Contusion of right knee, initial encounter: Secondary | ICD-10-CM | POA: Diagnosis not present

## 2015-08-04 DIAGNOSIS — R413 Other amnesia: Secondary | ICD-10-CM

## 2015-08-04 DIAGNOSIS — R5381 Other malaise: Secondary | ICD-10-CM | POA: Diagnosis present

## 2015-08-04 DIAGNOSIS — I350 Nonrheumatic aortic (valve) stenosis: Secondary | ICD-10-CM

## 2015-08-04 DIAGNOSIS — E43 Unspecified severe protein-calorie malnutrition: Secondary | ICD-10-CM | POA: Diagnosis present

## 2015-08-04 LAB — URINALYSIS, ROUTINE W REFLEX MICROSCOPIC
Bilirubin Urine: NEGATIVE
Glucose, UA: NEGATIVE mg/dL
Ketones, ur: 15 mg/dL — AB
Nitrite: POSITIVE — AB
Protein, ur: 30 mg/dL — AB
Specific Gravity, Urine: 1.023 (ref 1.005–1.030)
pH: 5.5 (ref 5.0–8.0)

## 2015-08-04 LAB — CBC WITH DIFFERENTIAL/PLATELET
BASOS ABS: 0 10*3/uL (ref 0.0–0.1)
Basophils Relative: 0 %
Eosinophils Absolute: 0.1 10*3/uL (ref 0.0–0.7)
Eosinophils Relative: 1 %
HEMATOCRIT: 30.9 % — AB (ref 36.0–46.0)
HEMOGLOBIN: 9.8 g/dL — AB (ref 12.0–15.0)
LYMPHS PCT: 11 %
Lymphs Abs: 0.8 10*3/uL (ref 0.7–4.0)
MCH: 31.7 pg (ref 26.0–34.0)
MCHC: 31.7 g/dL (ref 30.0–36.0)
MCV: 100 fL (ref 78.0–100.0)
Monocytes Absolute: 0.4 10*3/uL (ref 0.1–1.0)
Monocytes Relative: 5 %
NEUTROS ABS: 6.5 10*3/uL (ref 1.7–7.7)
Neutrophils Relative %: 83 %
Platelets: 129 10*3/uL — ABNORMAL LOW (ref 150–400)
RBC: 3.09 MIL/uL — AB (ref 3.87–5.11)
RDW: 14.6 % (ref 11.5–15.5)
WBC: 7.8 10*3/uL (ref 4.0–10.5)

## 2015-08-04 LAB — URINE MICROSCOPIC-ADD ON

## 2015-08-04 LAB — BASIC METABOLIC PANEL
Anion gap: 9 (ref 5–15)
BUN: 24 mg/dL — AB (ref 6–20)
CHLORIDE: 103 mmol/L (ref 101–111)
CO2: 27 mmol/L (ref 22–32)
CREATININE: 0.91 mg/dL (ref 0.44–1.00)
Calcium: 8.8 mg/dL — ABNORMAL LOW (ref 8.9–10.3)
GFR, EST NON AFRICAN AMERICAN: 54 mL/min — AB (ref >60)
Glucose, Bld: 136 mg/dL — ABNORMAL HIGH (ref 65–99)
Potassium: 4.5 mmol/L (ref 3.5–5.1)
Sodium: 139 mmol/L (ref 135–145)

## 2015-08-04 LAB — PROTIME-INR
INR: 1.3 (ref 0.00–1.49)
Prothrombin Time: 16.3 s — ABNORMAL HIGH (ref 11.6–15.2)

## 2015-08-04 LAB — BRAIN NATRIURETIC PEPTIDE: B Natriuretic Peptide: 1223.1 pg/mL — ABNORMAL HIGH (ref 0.0–100.0)

## 2015-08-04 LAB — TROPONIN I: Troponin I: 0.03 ng/mL

## 2015-08-04 LAB — APTT: aPTT: 27 s (ref 24–37)

## 2015-08-04 MED ORDER — ONDANSETRON HCL 4 MG/2ML IJ SOLN: 4.0000 mg | Freq: Four times a day (QID) | INTRAMUSCULAR | Status: DC | PRN

## 2015-08-04 MED ORDER — SENNOSIDES-DOCUSATE SODIUM 8.6-50 MG PO TABS: 1.0000 | ORAL_TABLET | Freq: Every day | ORAL | Status: DC | PRN

## 2015-08-04 MED ORDER — ENOXAPARIN SODIUM 40 MG/0.4ML ~~LOC~~ SOLN
40.0000 mg | SUBCUTANEOUS | Status: DC
  Administered 2015-08-04 – 2015-08-05 (×2): 40 mg via SUBCUTANEOUS
  Filled 2015-08-04 (×2): qty 0.4

## 2015-08-04 MED ORDER — FUROSEMIDE 10 MG/ML IJ SOLN
40.0000 mg | Freq: Once | INTRAMUSCULAR | Status: AC
  Administered 2015-08-04: 40 mg via INTRAVENOUS
  Filled 2015-08-04: qty 4

## 2015-08-04 MED ORDER — ASPIRIN 81 MG PO CHEW
81.0000 mg | CHEWABLE_TABLET | Freq: Every day | ORAL | Status: DC
Start: 2015-08-04 — End: 2015-08-07
  Administered 2015-08-04 – 2015-08-06 (×3): 81 mg via ORAL
  Filled 2015-08-04 (×3): qty 1

## 2015-08-04 MED ORDER — CALCIUM CARBONATE-VITAMIN D 500-200 MG-UNIT PO TABS
2.0000 | ORAL_TABLET | Freq: Every day | ORAL | Status: DC
  Administered 2015-08-05 – 2015-08-06 (×2): 2 via ORAL
  Filled 2015-08-04 (×4): qty 2

## 2015-08-04 MED ORDER — ENSURE ENLIVE PO LIQD
237.0000 mL | Freq: Two times a day (BID) | ORAL | Status: DC
  Administered 2015-08-04 – 2015-08-06 (×5): 237 mL via ORAL

## 2015-08-04 MED ORDER — SODIUM CHLORIDE 0.9 % IV SOLN
INTRAVENOUS | Status: AC
  Administered 2015-08-04: 14:00:00 via INTRAVENOUS

## 2015-08-04 MED ORDER — LISINOPRIL 5 MG PO TABS
5.0000 mg | ORAL_TABLET | Freq: Every day | ORAL | Status: DC
  Administered 2015-08-04 – 2015-08-05 (×2): 5 mg via ORAL
  Filled 2015-08-04 (×2): qty 1

## 2015-08-04 MED ORDER — ONDANSETRON HCL 4 MG PO TABS: 4.0000 mg | ORAL_TABLET | Freq: Four times a day (QID) | ORAL | Status: DC | PRN

## 2015-08-04 MED ORDER — DEXTROSE 5 % IV SOLN
1.0000 g | INTRAVENOUS | Status: DC
  Administered 2015-08-05 – 2015-08-06 (×2): 1 g via INTRAVENOUS
  Filled 2015-08-04 (×3): qty 10

## 2015-08-04 MED ORDER — DEXTROSE 5 % IV SOLN
1.0000 g | Freq: Once | INTRAVENOUS | Status: AC
  Administered 2015-08-04: 1 g via INTRAVENOUS
  Filled 2015-08-04: qty 10

## 2015-08-04 MED ORDER — MECLIZINE HCL 25 MG PO TABS: 12.5000 mg | ORAL_TABLET | ORAL | Status: DC | PRN

## 2015-08-04 MED ORDER — VITAMIN D 1000 UNITS PO TABS
2000.0000 [IU] | ORAL_TABLET | Freq: Every day | ORAL | Status: DC
  Administered 2015-08-04 – 2015-08-06 (×3): 2000 [IU] via ORAL
  Filled 2015-08-04 (×3): qty 2

## 2015-08-04 MED ORDER — MAGNESIUM HYDROXIDE 400 MG/5ML PO SUSP: 30.0000 mL | Freq: Every day | ORAL | Status: DC | PRN

## 2015-08-04 MED ORDER — HYDROCODONE-ACETAMINOPHEN 5-325 MG PO TABS: 1.0000 | ORAL_TABLET | Freq: Four times a day (QID) | ORAL | Status: DC | PRN

## 2015-08-04 MED ORDER — FUROSEMIDE 20 MG PO TABS: 40.0000 mg | ORAL_TABLET | Freq: Every day | ORAL | Status: DC

## 2015-08-04 MED ORDER — NITROGLYCERIN 0.4 MG SL SUBL: 0.4000 mg | SUBLINGUAL_TABLET | SUBLINGUAL | Status: DC | PRN

## 2015-08-04 MED ORDER — ADULT MULTIVITAMIN W/MINERALS CH
1.0000 | ORAL_TABLET | Freq: Every day | ORAL | Status: DC
  Administered 2015-08-05 – 2015-08-06 (×2): 1 via ORAL
  Filled 2015-08-04 (×3): qty 1

## 2015-08-04 MED ORDER — SODIUM CHLORIDE 0.9% FLUSH
3.0000 mL | Freq: Two times a day (BID) | INTRAVENOUS | Status: DC
  Administered 2015-08-04 – 2015-08-06 (×5): 3 mL via INTRAVENOUS

## 2015-08-04 NOTE — H&P (Signed)
History and Physical    Natalie Arnold:[id number] DOB: Nov 27, 1923 DOA: 08/04/2015  PCP: Ginette Otto, MD Patient coming from: home  Chief Complaint: falls  HPI: Natalie Arnold is a very pleasant 82 y.o. female with medical history significant for hypertension, aortic stenosis, PE, PMR, OA of the left knee presents to the emergency department with the chief complaint of frequent falls. Initial evaluation reveals urinary tract infection and possible acute CHF.  Information is obtained from the patient and her grandson who is at the bedside. He reports frequent falls today. EMS was called and they report family reported 3 falls today. No signs of any head trauma or other injury. She is not on anticoagulation. She has no recollection of the actual falls. She denies headache visual disturbances chest pain palpitations shortness of breath. She denies cough lower extremity edema or orthopnea. She denies any abdominal pain nausea vomiting dysuria hematuria frequency or urgency. She denies any constipation diarrhea melena or bright red blood per rectum. No reports of any fever chills recent travel or sick contacts. [name] is primary caregiver reports good oral intake and appetite. Patient does have osteoarthritis and does complain of left knee pain on occasion.    ED Course: She received IV fluids and Rocephin.  Review of Systems: As per HPI otherwise 10 point review of systems negative.   Ambulatory Status: Uses a walker at home.  Past Medical History  Diagnosis Date  . Pulmonary embolus (HCC) 2007    status post Coumadin  . Hypertension   . Polymyalgia rheumatica (HCC)   . Osteoporosis   . Osteoarthritis of left knee   . Carotid artery occlusion     carotid bruits no ICA stenosis  . Anxiety   . PVC (premature ventricular contraction)   . Rotator cuff tendinitis   . Hyponatremia 05/2009    Mild  . Aortic stenosis, mild last echo 08/2009    pt declines further echoes 08/2010  .  History of shingles 2012    Past Surgical History  Procedure Laterality Date  . Abdonimal hysterectomy    . Skin cancer excision      Social History   Social History  . Marital Status: Divorced    Spouse Name: N/A  . Number of Children: N/A  . Years of Education: N/A   Occupational History  . Not on file.   Social History Main Topics  . Smoking status: Never Smoker   . Smokeless tobacco: Not on file  . Alcohol Use: No  . Drug Use: Not on file  . Sexual Activity: Not on file   Other Topics Concern  . Not on file   Social History Narrative    Allergies  Allergen Reactions  . Ultracet [Tramadol-Acetaminophen] Other (See Comments)    confusion  . Penicillins Rash    Family History  Problem Relation Age of Onset  . Emphysema Brother     Prior to Admission medications   Medication Sig Start Date End Date Taking? Authorizing Provider  aspirin 81 MG chewable tablet Chew 1 tablet (81 mg total) by mouth daily. 01/09/15   Nishant Dhungel, MD  Calcium Carbonate-Vitamin D (CALCIUM 600+D) 600-400 MG-UNIT per tablet Take 2 tablets by mouth daily.     Historical Provider, MD  cholecalciferol (VITAMIN D) 1000 UNITS tablet Take 2,000 Units by mouth daily.     Historical Provider, MD  feeding supplement, ENSURE ENLIVE, (ENSURE ENLIVE) LIQD Take 237 mLs by mouth 2 (two) times daily between meals. 01/09/15  Nishant Dhungel, MD  furosemide (LASIX) 40 MG tablet Take 1 tablet (40 mg total) by mouth daily. 01/09/15   Nishant Dhungel, MD  HYDROcodone-acetaminophen (NORCO/VICODIN) 5-325 MG per tablet Take 1 tablet by mouth every 6 (six) hours as needed for moderate pain.    Historical Provider, MD  lisinopril (PRINIVIL,ZESTRIL) 5 MG tablet Take 1 tablet (5 mg total) by mouth daily. 01/09/15   Nishant Dhungel, MD  LORazepam (ATIVAN) 1 MG tablet Take 0.5 mg by mouth 2 (two) times daily as needed for anxiety.     Historical Provider, MD  magnesium hydroxide (MILK OF MAGNESIA) 400 MG/5ML  suspension Take 30 mLs by mouth daily as needed for mild constipation.    Historical Provider, MD  meclizine (ANTIVERT) 12.5 MG tablet Take 12.5 mg by mouth as needed for dizziness.    Historical Provider, MD  Multiple Vitamin (MULTIVITAMIN) tablet Take 1 tablet by mouth daily.    Historical Provider, MD  nitroGLYCERIN (NITROSTAT) 0.4 MG SL tablet Place 1 tablet (0.4 mg total) under the tongue every 5 (five) minutes as needed for chest pain. 01/09/15   Nishant Dhungel, MD  sennosides-docusate sodium (SENOKOT-S) 8.6-50 MG tablet Take 1 tablet by mouth daily as needed for constipation.    Historical Provider, MD    Physical Exam: Filed Vitals:   08/04/15 1025 08/04/15 1207  BP: 128/72 147/87  Pulse: 87 85  Temp: 98.6 F (37 C)   TempSrc: Oral   Resp: 16 25  SpO2: 100% 100%     General:  Appears calm and comfortable, quite thin cachetic Eyes:  PERRL, EOMI, normal lids, iris ENT:  grossly normal hearing, lips & tongue, his membranes of her mouth are pink but dry, very poor dentition Neck:  no LAD, masses or thyromegaly Cardiovascular:  Irregularly irregular, +murmur. No LE edema. Pedal pulses present and palpable Respiratory:   Normal respiratory effort. Breath sounds with fine crackles bilateral bases. No wheeze no rhonchi Abdomen:  soft, ntnd, positive bowel sounds throughout no guarding or rebounding Skin:  no rash or induration seen on limited exam, several bruises upper extremities Musculoskeletal:  grossly normal tone BUE/BLE, good ROM, no bony abnormality bilateral knee contusions, no swelling. Psychiatric:  grossly normal mood and affect, speech fluent and appropriate, AOx3 Neurologic:  Oriented to self and place. Speech clear facial symmetry follows commands moves all extremities  Labs on Admission: I have personally reviewed following labs and imaging studies  CBC:  Recent Labs Lab 08/04/15 1210  WBC 7.8  NEUTROABS 6.5  HGB 9.8*  HCT 30.9*  MCV 100.0  PLT 129*    Basic Metabolic Panel:  Recent Labs Lab 08/04/15 1210  NA 139  K 4.5  CL 103  CO2 27  GLUCOSE 136*  BUN 24*  CREATININE 0.91  CALCIUM 8.8*   GFR: CrCl cannot be calculated (Unknown ideal weight.). Liver Function Tests: No results for input(s): AST, ALT, ALKPHOS, BILITOT, PROT, ALBUMIN in the last 168 hours. No results for input(s): LIPASE, AMYLASE in the last 168 hours. No results for input(s): AMMONIA in the last 168 hours. Coagulation Profile:  Recent Labs Lab 08/04/15 1210  INR 1.30   Cardiac Enzymes:  Recent Labs Lab 08/04/15 1210  TROPONINI 0.03   BNP (last 3 results) No results for input(s): PROBNP in the last 8760 hours. HbA1C: No results for input(s): HGBA1C in the last 72 hours. CBG: No results for input(s): GLUCAP in the last 168 hours. Lipid Profile: No results for input(s): CHOL, HDL, LDLCALC, TRIG,  CHOLHDL, LDLDIRECT in the last 72 hours. Thyroid Function Tests: No results for input(s): TSH, T4TOTAL, FREET4, T3FREE, THYROIDAB in the last 72 hours. Anemia Panel: No results for input(s): VITAMINB12, FOLATE, FERRITIN, TIBC, IRON, RETICCTPCT in the last 72 hours. Urine analysis:    Component Value Date/Time   COLORURINE YELLOW 08/04/2015 1045   APPEARANCEUR HAZY* 08/04/2015 1045   LABSPEC 1.023 08/04/2015 1045   PHURINE 5.5 08/04/2015 1045   GLUCOSEU NEGATIVE 08/04/2015 1045   HGBUR SMALL* 08/04/2015 1045   BILIRUBINUR NEGATIVE 08/04/2015 1045   KETONESUR 15* 08/04/2015 1045   PROTEINUR 30* 08/04/2015 1045   NITRITE POSITIVE* 08/04/2015 1045   LEUKOCYTESUR SMALL* 08/04/2015 1045    Creatinine Clearance: CrCl cannot be calculated (Unknown ideal weight.).  Sepsis Labs: @LABRCNTIP (procalcitonin:4,lacticidven:4) )No results found for this or any previous visit (from the past 240 hour(s)).   Radiological Exams on Admission: Dg Chest 2 View  08/04/2015  CLINICAL DATA:  Three falls over the past 6 our spurs. Possible syncope. EXAM: CHEST  2  VIEW COMPARISON:  01/05/2015 FINDINGS: Small pleural effusions bilaterally. Bronchitic or congestive interstitial coarsening. Chronic cardiomegaly and aortic tortuosity. History of aortic stenosis with known bulky aortic valve calcification. Partly visible remote lower thoracic compression fracture. Remote upper sternum fracture, healed. No acute fracture identified. IMPRESSION: 1. Cardiomegaly and small pleural effusions. Interstitial coarsening could be congestive or bronchitic. 2. Chronic hyperinflation. Electronically Signed   By: Marnee Spring M.D.   On: 08/04/2015 11:53   Dg Pelvis 1-2 Views  08/04/2015  CLINICAL DATA:  82 year old female with multiple recent falls. Hip and pelvis pain. Initial encounter. EXAM: PELVIS - 1-2 VIEW COMPARISON:  Pelvis series 16109. FINDINGS: Femoral heads are normally located. Hip joint spaces are stable and normal for age. Proximal femurs appear grossly stable and intact. Osteopenia. Bone detail of the pelvis also obscured by abundant bowel gas in nondilated loops. No acute pelvic fracture identified. Partially visible advanced lower lumbar disc and endplate degeneration. Calcified femoral artery atherosclerosis. IMPRESSION: No acute fracture or dislocation identified about the pelvis. If there is lateralizing hip pain then recommend dedicated hip series. Electronically Signed   By: Odessa Fleming M.D.   On: 08/04/2015 11:52   Dg Wrist Complete Right  08/04/2015  CLINICAL DATA:  Multifocal pain after multiple falls today. Initial encounter. EXAM: RIGHT WRIST - COMPLETE 3+ VIEW COMPARISON:  None. FINDINGS: The bones are demineralized. There is no evidence of acute fracture or dislocation. Degenerative changes are present at the first [facility] articulation. There is prominent chondrocalcinosis of the TFCC. Mild dorsal soft tissue swelling noted. IMPRESSION: No evidence of acute osseous injury at the right wrist. Degenerative changes as described. Electronically Signed   By: Carey Bullocks M.D.   On: 08/04/2015 11:53   Ct Head Wo Contrast  08/04/2015  CLINICAL DATA:  Fall. EXAM: CT HEAD WITHOUT CONTRAST CT CERVICAL SPINE WITHOUT CONTRAST TECHNIQUE: Multidetector CT imaging of the head and cervical spine was performed following the standard protocol without intravenous contrast. Multiplanar CT image reconstructions of the cervical spine were also generated. COMPARISON:  Brain MRI 06/12/2011 FINDINGS: CT HEAD FINDINGS Skull and Sinuses:Negative for acute fracture. Chronic sinusitis on the left with complete opacification of the left maxillary, ethmoid, and frontal sinuses. Left anterior and posterior ethmoid air cells are expanded with bony thinning along the left orbit and anterior cranial fossa, consistent with mucocele. The left frontal sinus is also expanded, best seen along the intersinus septum. A left Onodi cell is expanded and partially  fills the left sphenoid sinus, which is otherwise patent. Bony sclerosis attributed to chronic inflammation. No acute soft tissue or orbital inflammation is noted. Patient is at risk for orbital or intracranial complication. Chronic right mastoiditis with subtotal opacification and clear nasopharynx. Brain: No evidence of acute infarction, hemorrhage, hydrocephalus, or mass lesion/mass effect. Chronic small vessel disease with confluent ischemic gliosis in the cerebral white matter. Mild generalized atrophy with progression since previous. Interval but chronic appearing left thalamus lacunar infarct. CT CERVICAL SPINE FINDINGS Remote appearing T1 and T2 superior endplate fractures. No acute fracture suspected. No traumatic malalignment. Diffuse disc narrowing with endplate ridging. Multilevel facet arthropathy with mild retrolisthesis at C3-4 and C4-5. Prominent dorsal ligamentous ossification, especially at C5-6, with canal stenosis and presumed cord mass effect. Septal thickening at the apices attributed to pulmonary edema given earlier chest x-ray.  IMPRESSION: 1. No evidence of acute intracranial or cervical spine injury. 2. 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 3. Disc degeneration and dorsal ligamentous ossification with moderate canal stenosis at C4-5 and C5-6. 4. Chronic microvascular disease and mild atrophy with progression since 2013. 5. Mild pulmonary edema at the apices. Electronically Signed   By: Marnee Spring M.D.   On: 08/04/2015 12:29   Ct Cervical Spine Wo Contrast  08/04/2015  CLINICAL DATA:  Fall. EXAM: CT HEAD WITHOUT CONTRAST CT CERVICAL SPINE WITHOUT CONTRAST TECHNIQUE: Multidetector CT imaging of the head and cervical spine was performed following the standard protocol without intravenous contrast. Multiplanar CT image reconstructions of the cervical spine were also generated. COMPARISON:  Brain MRI 06/12/2011 FINDINGS: CT HEAD FINDINGS Skull and Sinuses:Negative for acute fracture. Chronic sinusitis on the left with complete opacification of the left maxillary, ethmoid, and frontal sinuses. Left anterior and posterior ethmoid air cells are expanded with bony thinning along the left orbit and anterior cranial fossa, consistent with mucocele. The left frontal sinus is also expanded, best seen along the intersinus septum. A left Onodi cell is expanded and partially fills the left sphenoid sinus, which is otherwise patent. Bony sclerosis attributed to chronic inflammation. No acute soft tissue or orbital inflammation is noted. Patient is at risk for orbital or intracranial complication. Chronic right mastoiditis with subtotal opacification and clear nasopharynx. Brain: No evidence of acute infarction, hemorrhage, hydrocephalus, or mass lesion/mass effect. Chronic small vessel disease with confluent ischemic gliosis in the cerebral white matter. Mild generalized atrophy with progression since previous. Interval but chronic appearing left thalamus lacunar infarct. CT CERVICAL SPINE FINDINGS Remote  appearing T1 and T2 superior endplate fractures. No acute fracture suspected. No traumatic malalignment. Diffuse disc narrowing with endplate ridging. Multilevel facet arthropathy with mild retrolisthesis at C3-4 and C4-5. Prominent dorsal ligamentous ossification, especially at C5-6, with canal stenosis and presumed cord mass effect. Septal thickening at the apices attributed to pulmonary edema given earlier chest x-ray. IMPRESSION: 1. No evidence of acute intracranial or cervical spine injury. 2. 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 3. Disc degeneration and dorsal ligamentous ossification with moderate canal stenosis at C4-5 and C5-6. 4. Chronic microvascular disease and mild atrophy with progression since 2013. 5. Mild pulmonary edema at the apices. Electronically Signed   By: Marnee Spring M.D.   On: 08/04/2015 12:29   Dg Knee Complete 4 Views Left  08/04/2015  CLINICAL DATA:  82 year old female with multiple recent falls. Pain. Initial encounter. EXAM: LEFT KNEE - COMPLETE 4+ VIEW COMPARISON:  Left knee series 10 10/10/2003. FINDINGS: Osteopenia. Chronic posterior  calcified loose bodies. Chronic moderate to severe medial compartment joint space loss and degenerative spurring. Progressed lateral compartment chondrocalcinosis. Chronic severe patellofemoral compartment degenerative spurring. No joint effusion identified on cross-table lateral. No acute fracture or dislocation identified. IMPRESSION: 1. Osteopenia. No acute fracture or dislocation identified about the left knee. 2. Advanced chronic left knee joint degeneration, with progressed Chondrocalcinosis since 2005 which can be seen in the setting of calcium pyrophosphate deposition disease. Electronically Signed   By: Odessa FlemingH  Hall M.D.   On: 08/04/2015 11:54   Dg Knee Complete 4 Views Right  08/04/2015  CLINICAL DATA:  Right knee pain after multiple falls. Initial encounter. EXAM: RIGHT KNEE - COMPLETE 4+ VIEW  COMPARISON:  None. FINDINGS: No evidence of fracture, dislocation, or joint effusion. Mild narrowing of the medial and lateral joint spaces is noted with chondrocalcinosis present laterally. Soft tissues are unremarkable. IMPRESSION: Mild degenerative joint disease. No acute abnormality seen in the right knee. Electronically Signed   By: Lupita RaiderJames  Green Jr, M.D.   On: 08/04/2015 11:58   Dg Hand Complete Right  08/04/2015  CLINICAL DATA:  Multifocal pain after multiple falls today. Initial encounter. EXAM: RIGHT HAND - COMPLETE 3+ VIEW COMPARISON:  None. FINDINGS: The bones are diffusely demineralized. There is no evidence of acute fracture or dislocation. There are mild diffuse interphalangeal degenerative changes. Lesser degenerative changes are present at the MCP joints and first [facility] joint. Wrist findings otherwise dictated separately. IMPRESSION: No acute osseous findings in the hand. Osteopenia and diffuse degenerative changes. Electronically Signed   By: Carey BullocksWilliam  Veazey M.D.   On: 08/04/2015 11:52    EKG: Independently reviewed. Sinus rhythm Prolonged PR interval Consider left atrial enlargement Left ventricular hypertrophy Nonspecific T abnormalities, lateral leads  Assessment/Plan Principal Problem:   UTI (lower urinary tract infection) Active Problems:   Essential hypertension   Memory loss   Aortic stenosis, severe   Acute on chronic congestive heart failure (HCC)   Macrocytic anemia   Falls    #1. Urinary tract infection. Urinalysis consistent with UTI. He is afebrile no leukocytosis nontoxic appearing. -Admit to telemetry -Follow urine culture -Gentle IV fluids -Provide Rocephin -Adjust antibiotics as indicated  #2. Acute on chronic diastolic heart failure. Mild. BNP 1200 compared to 2387 months ago with admission on same, chest x-ray cardiomegaly with small pleural effusions interstitial coarseness could be congestive or bronchitic. Oxygen saturation level greater than 90% on 2  L, no increased work of breathing. Echo done 7 months ago reveals EF 55-60%. Home medications include Lasix 40 mg by mouth. She is provided with 40 mg Lasix IV in the emergency department -Monitor intake and output -Obtain daily weights -Continue lisinopril -Reevaluate tomorrow for further need of IV diuresis. If not needed, recommend continuing home Lasix 40 mg by mouth  #3. Falls. Likely related to above. Medication review reveals Ativan and hydrocodone. CT of the head without acute abnormality. CT of C-spine no evidence of acute cervical spine injury. Plain films of right hand right wrist pelvis left knee with no acute osseous findings. Initial troponin negative. EKG without acute changes Baseline is rolling walker. Had home health PT after last hospitalization -cycle troponin -Physical therapy -Hold sedating drugs -See #1  #4. Normocytic anemia. Hemoglobin 9.8 on admission. Chart review indicates this is close to baseline. No signs symptoms of bleeding. -monitor  #5. Hypertension. Fair control in the emergency department. Home meds include Lasix, lisinopril -Continue home meds  6. Dementia/anxiety. Appears stable at baseline. Patient is able to  make her wants and needs known. -Hold Ativan for now   DVT prophylaxis: lovenox Code Status: dnr  Family Communication: grandson at bedside  Disposition Plan: home  Consults called: none  Admission status: obs    Toya Smothers M MD [facility]  If 7PM-7AM, please contact night-coverage [email or web address] Password [email or web address]  08/04/2015, 2:26 PM

## 2015-08-04 NOTE — ED Notes (Signed)
Pt transported to xray 

## 2015-08-04 NOTE — Progress Notes (Signed)
New Admission Note: transfer ED  Arrival Method: stretcher Mental Orientation: oriented to self. (Baseline) Telemetry: placed Assessment: Completed Skin: bruised R hand IV:LFA NS 50 Pain: none Tubes: none Safety Measures: Safety Fall Prevention Plan has been given, discussed and signed Admission: Completed Unit Orientation: Patient has been orientated to the room, unit and staff.  Family:none present  Orders have been reviewed and implemented. Will continue to monitor the patient. Call light has been placed within reach and bed alarm has been activated.   Janeann ForehandLuke Sonja Manseau BSN, RN

## 2015-08-04 NOTE — ED Notes (Signed)
PA at bedside.

## 2015-08-04 NOTE — ED Notes (Signed)
Per [facility] - pt from home. Pt fell 3x in last 6 hrs. Last fall was backwards, grabbed on door jam, did not hit head. No specific complaints of pain. Chronic generalized pain. Bruising to right hand. Hx HTN and heart murmur. C-collar in place as a precaution. CBG 129.

## 2015-08-04 NOTE — ED Provider Notes (Signed)
CSN: [id number]     Arrival date & time 08/04/15  1021 History   First MD Initiated Contact with Patient 08/04/15 1022     Chief Complaint  Patient presents with  . Fall     (Consider location/radiation/quality/duration/timing/severity/associated sxs/prior Treatment) HPI  Blood pressure 128/72, pulse 87, temperature 98.6 F (37 C), temperature source Oral, resp. rate 16, SpO2 100 %.  Natalie Arnold is a 82 y.o. female brought in by EMS from multiple falls proximally 3 in the last 6 hours. As per EMS family called, poor historians, states that one fall was witnessed and there was no head trauma, she has no anticoagulation listed on her list of medications. No family members at bedside. Level V caveat secondary to dementia. Patient with no complaints, oriented to self. Specific complaints.  [name] Maybe Son   Past Medical History  Diagnosis Date  . Pulmonary embolus (HCC) 2007    status post Coumadin  . Hypertension   . Polymyalgia rheumatica (HCC)   . Osteoporosis   . Osteoarthritis of left knee   . Carotid artery occlusion     carotid bruits no ICA stenosis  . Anxiety   . PVC (premature ventricular contraction)   . Rotator cuff tendinitis   . Hyponatremia 05/2009    Mild  . Aortic stenosis, mild last echo 08/2009    pt declines further echoes 08/2010  . History of shingles 2012   Past Surgical History  Procedure Laterality Date  . Abdonimal hysterectomy    . Skin cancer excision     Family History  Problem Relation Age of Onset  . Emphysema Brother    Social History  Substance Use Topics  . Smoking status: Never Smoker   . Smokeless tobacco: None  . Alcohol Use: No   OB History    No data available     Review of Systems  Unable to perform ROS: Dementia      Allergies  Ultracet and Penicillins  Home Medications   Prior to Admission medications   Medication Sig Start Date End Date Taking? Authorizing Provider  aspirin 81 MG chewable tablet Chew 1 tablet  (81 mg total) by mouth daily. 01/09/15   Nishant Dhungel, MD  Calcium Carbonate-Vitamin D (CALCIUM 600+D) 600-400 MG-UNIT per tablet Take 2 tablets by mouth daily.     Historical Provider, MD  cholecalciferol (VITAMIN D) 1000 UNITS tablet Take 2,000 Units by mouth daily.     Historical Provider, MD  feeding supplement, ENSURE ENLIVE, (ENSURE ENLIVE) LIQD Take 237 mLs by mouth 2 (two) times daily between meals. 01/09/15   Nishant Dhungel, MD  furosemide (LASIX) 40 MG tablet Take 1 tablet (40 mg total) by mouth daily. 01/09/15   Nishant Dhungel, MD  HYDROcodone-acetaminophen (NORCO/VICODIN) 5-325 MG per tablet Take 1 tablet by mouth every 6 (six) hours as needed for moderate pain.    Historical Provider, MD  lisinopril (PRINIVIL,ZESTRIL) 5 MG tablet Take 1 tablet (5 mg total) by mouth daily. 01/09/15   Nishant Dhungel, MD  LORazepam (ATIVAN) 1 MG tablet Take 0.5 mg by mouth 2 (two) times daily as needed for anxiety.     Historical Provider, MD  magnesium hydroxide (MILK OF MAGNESIA) 400 MG/5ML suspension Take 30 mLs by mouth daily as needed for mild constipation.    Historical Provider, MD  meclizine (ANTIVERT) 12.5 MG tablet Take 12.5 mg by mouth as needed for dizziness.    Historical Provider, MD  Multiple Vitamin (MULTIVITAMIN) tablet Take 1 tablet by  mouth daily.    Historical Provider, MD  nitroGLYCERIN (NITROSTAT) 0.4 MG SL tablet Place 1 tablet (0.4 mg total) under the tongue every 5 (five) minutes as needed for chest pain. 01/09/15   Nishant Dhungel, MD  sennosides-docusate sodium (SENOKOT-S) 8.6-50 MG tablet Take 1 tablet by mouth daily as needed for constipation.    Historical Provider, MD   BP 147/87 mmHg  Pulse 85  Temp(Src) 98.6 F (37 C) (Oral)  Resp 25  SpO2 100% Physical Exam  Constitutional: She appears well-developed and well-nourished. No distress.  HENT:  Head: Normocephalic and atraumatic.  Mouth/Throat: Oropharynx is clear and moist.  Eyes: Conjunctivae and EOM are  normal. Pupils are equal, round, and reactive to light.  Neck:  Rigid c-collar in place, no midline tenderness  Cardiovascular: Normal rate, regular rhythm and intact distal pulses.   Murmur heard. Systolic murmer  Pulmonary/Chest: Effort normal and breath sounds normal. No stridor. No respiratory distress. She has no wheezes. She has no rales. She exhibits no tenderness.  Abdominal: Soft. Bowel sounds are normal. She exhibits no distension and no mass. There is no tenderness. There is no rebound and no guarding.  Musculoskeletal: Normal range of motion. She exhibits tenderness.  Ecchymosis to dorsum of right hand, diffusely tender to palpation along the metacarpals and wrist. No focal snuffbox tenderness.  Bilateral knee contusions, appeared to be recent, full passive range of motion  Neurological: She is alert. No cranial nerve deficit.  Moving all extremities, follows simple commands, pupils equal round and reactive, tongue extension midline.  Psychiatric: She has a normal mood and affect.  Nursing note and vitals reviewed.   ED Course  Procedures (including critical care time) Labs Review Labs Reviewed  URINALYSIS, ROUTINE W REFLEX MICROSCOPIC (NOT AT [facility]) - Abnormal; Notable for the following:    APPearance HAZY (*)    Hgb urine dipstick SMALL (*)    Ketones, ur 15 (*)    Protein, ur 30 (*)    Nitrite POSITIVE (*)    Leukocytes, UA SMALL (*)    All other components within normal limits  CBC WITH DIFFERENTIAL/PLATELET - Abnormal; Notable for the following:    RBC 3.09 (*)    Hemoglobin 9.8 (*)    HCT 30.9 (*)    Platelets 129 (*)    All other components within normal limits  BASIC METABOLIC PANEL - Abnormal; Notable for the following:    Glucose, Bld 136 (*)    BUN 24 (*)    Calcium 8.8 (*)    GFR calc non Af Amer 54 (*)    All other components within normal limits  PROTIME-INR - Abnormal; Notable for the following:    Prothrombin Time 16.3 (*)    All other components  within normal limits  BRAIN NATRIURETIC PEPTIDE - Abnormal; Notable for the following:    B Natriuretic Peptide 1223.1 (*)    All other components within normal limits  URINE MICROSCOPIC-ADD ON - Abnormal; Notable for the following:    Squamous Epithelial / LPF 0-5 (*)    Bacteria, UA MANY (*)    All other components within normal limits  URINE CULTURE  APTT  TROPONIN I    Imaging Review Dg Chest 2 View  08/04/2015  CLINICAL DATA:  Three falls over the past 6 our spurs. Possible syncope. EXAM: CHEST  2 VIEW COMPARISON:  01/05/2015 FINDINGS: Small pleural effusions bilaterally. Bronchitic or congestive interstitial coarsening. Chronic cardiomegaly and aortic tortuosity. History of aortic stenosis with known  bulky aortic valve calcification. Partly visible remote lower thoracic compression fracture. Remote upper sternum fracture, healed. No acute fracture identified. IMPRESSION: 1. Cardiomegaly and small pleural effusions. Interstitial coarsening could be congestive or bronchitic. 2. Chronic hyperinflation. Electronically Signed   By: Marnee Spring M.D.   On: 08/04/2015 11:53   Dg Pelvis 1-2 Views  08/04/2015  CLINICAL DATA:  82 year old female with multiple recent falls. Hip and pelvis pain. Initial encounter. EXAM: PELVIS - 1-2 VIEW COMPARISON:  Pelvis series [id number]. FINDINGS: Femoral heads are normally located. Hip joint spaces are stable and normal for age. Proximal femurs appear grossly stable and intact. Osteopenia. Bone detail of the pelvis also obscured by abundant bowel gas in nondilated loops. No acute pelvic fracture identified. Partially visible advanced lower lumbar disc and endplate degeneration. Calcified femoral artery atherosclerosis. IMPRESSION: No acute fracture or dislocation identified about the pelvis. If there is lateralizing hip pain then recommend dedicated hip series. Electronically Signed   By: Odessa Fleming M.D.   On: 08/04/2015 11:52   Dg Wrist Complete Right  08/04/2015   CLINICAL DATA:  Multifocal pain after multiple falls today. Initial encounter. EXAM: RIGHT WRIST - COMPLETE 3+ VIEW COMPARISON:  None. FINDINGS: The bones are demineralized. There is no evidence of acute fracture or dislocation. Degenerative changes are present at the first [facility] articulation. There is prominent chondrocalcinosis of the TFCC. Mild dorsal soft tissue swelling noted. IMPRESSION: No evidence of acute osseous injury at the right wrist. Degenerative changes as described. Electronically Signed   By: Carey Bullocks M.D.   On: 08/04/2015 11:53   Ct Head Wo Contrast  08/04/2015  CLINICAL DATA:  Fall. EXAM: CT HEAD WITHOUT CONTRAST CT CERVICAL SPINE WITHOUT CONTRAST TECHNIQUE: Multidetector CT imaging of the head and cervical spine was performed following the standard protocol without intravenous contrast. Multiplanar CT image reconstructions of the cervical spine were also generated. COMPARISON:  Brain MRI 06/12/2011 FINDINGS: CT HEAD FINDINGS Skull and Sinuses:Negative for acute fracture. Chronic sinusitis on the left with complete opacification of the left maxillary, ethmoid, and frontal sinuses. Left anterior and posterior ethmoid air cells are expanded with bony thinning along the left orbit and anterior cranial fossa, consistent with mucocele. The left frontal sinus is also expanded, best seen along the intersinus septum. A left Onodi cell is expanded and partially fills the left sphenoid sinus, which is otherwise patent. Bony sclerosis attributed to chronic inflammation. No acute soft tissue or orbital inflammation is noted. Patient is at risk for orbital or intracranial complication. Chronic right mastoiditis with subtotal opacification and clear nasopharynx. Brain: No evidence of acute infarction, hemorrhage, hydrocephalus, or mass lesion/mass effect. Chronic small vessel disease with confluent ischemic gliosis in the cerebral white matter. Mild generalized atrophy with progression since previous.  Interval but chronic appearing left thalamus lacunar infarct. CT CERVICAL SPINE FINDINGS Remote appearing T1 and T2 superior endplate fractures. No acute fracture suspected. No traumatic malalignment. Diffuse disc narrowing with endplate ridging. Multilevel facet arthropathy with mild retrolisthesis at C3-4 and C4-5. Prominent dorsal ligamentous ossification, especially at C5-6, with canal stenosis and presumed cord mass effect. Septal thickening at the apices attributed to pulmonary edema given earlier chest x-ray. IMPRESSION: 1. No evidence of acute intracranial or cervical spine injury. 2. 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 3. Disc degeneration and dorsal ligamentous ossification with moderate canal stenosis at C4-5 and C5-6. 4. Chronic microvascular disease and mild atrophy with progression since 2013. 5. Mild pulmonary edema at the apices.  Electronically Signed   By: Marnee Spring M.D.   On: 08/04/2015 12:29   Ct Cervical Spine Wo Contrast  08/04/2015  CLINICAL DATA:  Fall. EXAM: CT HEAD WITHOUT CONTRAST CT CERVICAL SPINE WITHOUT CONTRAST TECHNIQUE: Multidetector CT imaging of the head and cervical spine was performed following the standard protocol without intravenous contrast. Multiplanar CT image reconstructions of the cervical spine were also generated. COMPARISON:  Brain MRI 06/12/2011 FINDINGS: CT HEAD FINDINGS Skull and Sinuses:Negative for acute fracture. Chronic sinusitis on the left with complete opacification of the left maxillary, ethmoid, and frontal sinuses. Left anterior and posterior ethmoid air cells are expanded with bony thinning along the left orbit and anterior cranial fossa, consistent with mucocele. The left frontal sinus is also expanded, best seen along the intersinus septum. A left Onodi cell is expanded and partially fills the left sphenoid sinus, which is otherwise patent. Bony sclerosis attributed to chronic inflammation. No acute  soft tissue or orbital inflammation is noted. Patient is at risk for orbital or intracranial complication. Chronic right mastoiditis with subtotal opacification and clear nasopharynx. Brain: No evidence of acute infarction, hemorrhage, hydrocephalus, or mass lesion/mass effect. Chronic small vessel disease with confluent ischemic gliosis in the cerebral white matter. Mild generalized atrophy with progression since previous. Interval but chronic appearing left thalamus lacunar infarct. CT CERVICAL SPINE FINDINGS Remote appearing T1 and T2 superior endplate fractures. No acute fracture suspected. No traumatic malalignment. Diffuse disc narrowing with endplate ridging. Multilevel facet arthropathy with mild retrolisthesis at C3-4 and C4-5. Prominent dorsal ligamentous ossification, especially at C5-6, with canal stenosis and presumed cord mass effect. Septal thickening at the apices attributed to pulmonary edema given earlier chest x-ray. IMPRESSION: 1. No evidence of acute intracranial or cervical spine injury. 2. 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 3. Disc degeneration and dorsal ligamentous ossification with moderate canal stenosis at C4-5 and C5-6. 4. Chronic microvascular disease and mild atrophy with progression since 2013. 5. Mild pulmonary edema at the apices. Electronically Signed   By: Marnee Spring M.D.   On: 08/04/2015 12:29   Dg Knee Complete 4 Views Left  08/04/2015  CLINICAL DATA:  82 year old female with multiple recent falls. Pain. Initial encounter. EXAM: LEFT KNEE - COMPLETE 4+ VIEW COMPARISON:  Left knee series 10 10/10/2003. FINDINGS: Osteopenia. Chronic posterior calcified loose bodies. Chronic moderate to severe medial compartment joint space loss and degenerative spurring. Progressed lateral compartment chondrocalcinosis. Chronic severe patellofemoral compartment degenerative spurring. No joint effusion identified on cross-table lateral. No acute  fracture or dislocation identified. IMPRESSION: 1. Osteopenia. No acute fracture or dislocation identified about the left knee. 2. Advanced chronic left knee joint degeneration, with progressed Chondrocalcinosis since 2005 which can be seen in the setting of calcium pyrophosphate deposition disease. Electronically Signed   By: Odessa Fleming M.D.   On: 08/04/2015 11:54   Dg Knee Complete 4 Views Right  08/04/2015  CLINICAL DATA:  Right knee pain after multiple falls. Initial encounter. EXAM: RIGHT KNEE - COMPLETE 4+ VIEW COMPARISON:  None. FINDINGS: No evidence of fracture, dislocation, or joint effusion. Mild narrowing of the medial and lateral joint spaces is noted with chondrocalcinosis present laterally. Soft tissues are unremarkable. IMPRESSION: Mild degenerative joint disease. No acute abnormality seen in the right knee. Electronically Signed   By: Lupita Raider, M.D.   On: 08/04/2015 11:58   Dg Hand Complete Right  08/04/2015  CLINICAL DATA:  Multifocal pain after multiple falls today. Initial encounter. EXAM: RIGHT HAND -  COMPLETE 3+ VIEW COMPARISON:  None. FINDINGS: The bones are diffusely demineralized. There is no evidence of acute fracture or dislocation. There are mild diffuse interphalangeal degenerative changes. Lesser degenerative changes are present at the MCP joints and first [facility] joint. Wrist findings otherwise dictated separately. IMPRESSION: No acute osseous findings in the hand. Osteopenia and diffuse degenerative changes. Electronically Signed   By: Carey Bullocks M.D.   On: 08/04/2015 11:52   I have personally reviewed and evaluated these images and lab results as part of my medical decision-making.   EKG Interpretation None      MDM   Final diagnoses:  Acute cystitis without hematuria  Fall at home, initial encounter    Filed Vitals:   08/04/15 1025 08/04/15 1207  BP: 128/72 147/87  Pulse: 87 85  Temp: 98.6 F (37 C)   TempSrc: Oral   Resp: 16 25  SpO2: 100% 100%     Medications  cefTRIAXone (ROCEPHIN) 1 g in dextrose 5 % 50 mL IVPB (1 g Intravenous New Bag/Given 08/04/15 1219)    JYLA HOPF is 82 y.o. female presenting with Multiple falls this a.m. Patient with history of dementia, poor historian, no family member at bedside.  Attempted to call son, left message, no return call.   Patient's chest x-ray is suspicious for CHF, she is mildly elevated proBNP at 1200, urinalysis consistent with infection. Patient will be started on Rocephin and urine culture is ordered. Even her multiple comorbidities, frequent falls versus syncope in the setting of severe aortic stenosis will need admission.  This is a shared visit with the attending physician who personally evaluated the patient and agrees with the care plan.   Discussed with Toya Smothers who accepts admission on behalf of Dr. Konrad Dolores.   Wynetta Emery, PA-C 08/04/15 1342  Rolland Porter, MD 08/14/15 1343

## 2015-08-04 NOTE — Progress Notes (Signed)
CSW was contacted to assist with locating family for Patient. Per previous records, Patient has lived with her grandson, Deno EtienneBrian Besecker, and his girlfriend for many years and they have been providing care for her. CSW contacted Deno EtienneBrian Bernardini at [phone]. He reports that he is on his way to the hospital now and should be here in the next 15-20 minutes. Case Manager and MD notified.     Lance MussAshley Gardner,MSW, LCSW [facility] ED/20M Clinical Social Worker [phone]

## 2015-08-05 DIAGNOSIS — R5381 Other malaise: Secondary | ICD-10-CM | POA: Diagnosis not present

## 2015-08-05 DIAGNOSIS — W19XXXD Unspecified fall, subsequent encounter: Secondary | ICD-10-CM | POA: Diagnosis not present

## 2015-08-05 DIAGNOSIS — N390 Urinary tract infection, site not specified: Secondary | ICD-10-CM | POA: Diagnosis not present

## 2015-08-05 DIAGNOSIS — N3000 Acute cystitis without hematuria: Secondary | ICD-10-CM | POA: Diagnosis not present

## 2015-08-05 DIAGNOSIS — I10 Essential (primary) hypertension: Secondary | ICD-10-CM | POA: Diagnosis not present

## 2015-08-05 DIAGNOSIS — I5033 Acute on chronic diastolic (congestive) heart failure: Secondary | ICD-10-CM | POA: Diagnosis not present

## 2015-08-05 LAB — CBC
HEMATOCRIT: 28.5 % — AB (ref 36.0–46.0)
HEMOGLOBIN: 9 g/dL — AB (ref 12.0–15.0)
MCH: 31 pg (ref 26.0–34.0)
MCHC: 31.6 g/dL (ref 30.0–36.0)
MCV: 98.3 fL (ref 78.0–100.0)
Platelets: 139 10*3/uL — ABNORMAL LOW (ref 150–400)
RBC: 2.9 MIL/uL — ABNORMAL LOW (ref 3.87–5.11)
RDW: 14.5 % (ref 11.5–15.5)
WBC: 9.2 10*3/uL (ref 4.0–10.5)

## 2015-08-05 LAB — BASIC METABOLIC PANEL
Anion gap: 9 (ref 5–15)
BUN: 28 mg/dL — AB (ref 6–20)
CHLORIDE: 98 mmol/L — AB (ref 101–111)
CO2: 31 mmol/L (ref 22–32)
CREATININE: 1.29 mg/dL — AB (ref 0.44–1.00)
Calcium: 8.5 mg/dL — ABNORMAL LOW (ref 8.9–10.3)
GFR calc Af Amer: 41 mL/min — ABNORMAL LOW (ref >60)
GFR calc non Af Amer: 35 mL/min — ABNORMAL LOW (ref >60)
Glucose, Bld: 148 mg/dL — ABNORMAL HIGH (ref 65–99)
Potassium: 3.6 mmol/L (ref 3.5–5.1)
SODIUM: 138 mmol/L (ref 135–145)

## 2015-08-05 MED ORDER — FUROSEMIDE 10 MG/ML IJ SOLN
40.0000 mg | Freq: Every day | INTRAMUSCULAR | Status: DC
  Administered 2015-08-05 – 2015-08-06 (×2): 40 mg via INTRAVENOUS
  Filled 2015-08-05 (×2): qty 4

## 2015-08-05 NOTE — Care Management Note (Signed)
Case Management Note  Patient Details  Name: Natalie Arnold MRN: [id number] Date of Birth: [birth year] :             Case manager following for progression and d/c planning.   Action/Plan: 08/05/2015 Noted PT recommendation for SNF. This CM met with pt who states that her grandson lives with her, he works during the day, however does the cooking and is with her at night. Pt states that she is able to function independent without a walker most of the time. Pt appears very frail, will discuss pt status with family when appropriate.  Expected Discharge Date:                  Expected Discharge Plan:  Skilled Nursing Facility  In-House Referral:  Clinical Social Work  Discharge planning Services  CM Consult  Post Acute Care Choice:    Choice offered to:     DME Arranged:    DME Agency:     HH Arranged:    [place] Agency:     Status of Service:  In process, will continue to follow  Medicare Important Message Given:    Date Medicare IM Given:    Medicare IM give by:    Date Additional Medicare IM Given:    Additional Medicare Important Message give by:     If discussed at [place] of Stay Meetings, dates discussed:    Additional Comments:  Adron Bene, RN 08/05/2015, 4:28 PM

## 2015-08-05 NOTE — Progress Notes (Signed)
PROGRESS NOTE                                                                                                                                                                                                             Patient Demographics:    Natalie Arnold, is a 82 y.o. female, DOB - 12-May-1923, ZOX:[id number]  Admit date - 08/04/2015   Admitting Physician Ozella Rocks, MD  Outpatient Primary MD for the patient is Ginette Otto, MD  LOS -   Outpatient Specialists: None  Chief Complaint  Patient presents with  . Fall       Brief Narrative   82 year old female with history of hypertension, PE, CHF, aortic stenosis, polymyalgia rheumatica presented with frequent falls and found to have UTI along with acute on chronic CHF.   Subjective:   Patient denies any specific symptoms. Poor historian. No overnight issues.   Assessment  & Plan :    Principal Problem:   UTI (lower urinary tract infection) Empiric Rocephin. Follow cultures.  Active Problems:   Acute on chronic diastolic congestive heart failure (HCC) Elevated proBNP. Chest x-ray showing small pleural effusion. Monitor with IV Lasix. Strict I/O and daily weight. 2-D echo from 11/16 with normal EF, moderate to severe MR and elevated PA pressure.  Recurrent falls with physical deconditioning  PT  evaluation. Continue nutrition supplement.  AKI Mild possibly due to IV diuresis. Hold ACE inhibitor and monitor.  Essential hypertension Resume home medications  Mild-to-moderate dementia Stable.  Normocytic anemia Stable.    Code Status : DO NOT RESUSCITATE  Family Communication  : At bedside  Disposition Plan  : SNF for PT, possibly in the next 48 hours  Barriers For Discharge : Active/improving symptoms  Consults  : None  Procedures  : CT head and cervical spine  DVT Prophylaxis  :  Lovenox -  Lab Results  Component Value Date   PLT 139* 08/05/2015    Antibiotics  :    Anti-infectives    Start     Dose/Rate Route Frequency Ordered Stop   08/05/15 1200  cefTRIAXone (ROCEPHIN) 1 g in dextrose 5 % 50 mL IVPB     1 g 100 mL/hr over 30 Minutes Intravenous Every 24 hours 08/04/15 1351     08/04/15 1200  cefTRIAXone (ROCEPHIN) 1 g in dextrose 5 %  50 mL IVPB     1 g 100 mL/hr over 30 Minutes Intravenous  Once 08/04/15 1145 08/04/15 1300        Objective:   Filed Vitals:   08/04/15 1809 08/04/15 2210 08/05/15 0645 08/05/15 0913  BP: 135/78 120/62 117/60 111/64  Pulse: 80 89 91 87  Temp: 98.9 F (37.2 C) 99.1 F (37.3 C) 100 F (37.8 C) 98.9 F (37.2 C)  TempSrc: Oral Oral Oral Oral  Resp: Weight:  50.803 kg (112 lb)    SpO2: 100% 100% 100% 100%    Wt Readings from Last 3 Encounters:  08/04/15 50.803 kg (112 lb)  01/09/15 51.98 kg (114 lb 9.5 oz)     Intake/Output Summary (Last 24 hours) at 08/05/15 1435 Last data filed at 08/05/15 1416  Gross per 24 hour  Intake    360 ml  Output      0 ml  Net    360 ml     Physical Exam  Gen: Elderly thin built female not in distress HEENT: no pallor, moist mucosa, supple neck, no JVD Chest: Bibasilar crackles, respiratory rhonchi on right CVS: N S1&S2,syastolic murmur 3/6, no rubs or gallop GI: soft, NT, ND, BS+ Musculoskeletal: warm, no edema CNS: AAOX1-2, non focal    Data Review:    CBC  Recent Labs Lab 08/04/15 1210 08/05/15 0520  WBC 7.8 9.2  HGB 9.8* 9.0*  HCT 30.9* 28.5*  PLT 129* 139*  MCV 100.0 98.3  MCH 31.7 31.0  MCHC 31.7 31.6  RDW 14.6 14.5  LYMPHSABS 0.8  --   MONOABS 0.4  --   EOSABS 0.1  --   BASOSABS 0.0  --     Chemistries   Recent Labs Lab 08/04/15 1210 08/05/15 0520  NA 139 138  K 4.5 3.6  CL 103 98*  CO2 27 31  GLUCOSE 136* 148*  BUN 24* 28*  CREATININE 0.91 1.29*  CALCIUM 8.8* 8.5*    ------------------------------------------------------------------------------------------------------------------ No results for input(s): CHOL, HDL, LDLCALC, TRIG, CHOLHDL, LDLDIRECT in the last 72 hours.  Lab Results  Component Value Date   HGBA1C 5.3 01/06/2015   ------------------------------------------------------------------------------------------------------------------ No results for input(s): TSH, T4TOTAL, T3FREE, THYROIDAB in the last 72 hours.  Invalid input(s): FREET3 ------------------------------------------------------------------------------------------------------------------ No results for input(s): VITAMINB12, FOLATE, FERRITIN, TIBC, IRON, RETICCTPCT in the last 72 hours.  Coagulation profile  Recent Labs Lab 08/04/15 1210  INR 1.30    No results for input(s): DDIMER in the last 72 hours.  Cardiac Enzymes  Recent Labs Lab 08/04/15 1210  TROPONINI 0.03   ------------------------------------------------------------------------------------------------------------------    Component Value Date/Time   BNP 1223.1* 08/04/2015 1210    Inpatient Medications  Scheduled Meds: . aspirin  81 mg Oral Daily  . calcium-vitamin D  2 tablet Oral Daily  . cefTRIAXone (ROCEPHIN)  IV  1 g Intravenous Q24H  . cholecalciferol  2,000 Units Oral Daily  . enoxaparin (LOVENOX) injection  40 mg Subcutaneous Q24H  . feeding supplement (ENSURE ENLIVE)  237 mL Oral BID BM  . lisinopril  5 mg Oral Daily  . multivitamin with minerals  1 tablet Oral Daily  . sodium chloride flush  3 mL Intravenous Q12H   Continuous Infusions:  PRN Meds:.HYDROcodone-acetaminophen, magnesium hydroxide, meclizine, nitroGLYCERIN, ondansetron **OR** ondansetron (ZOFRAN) IV, senna-docusate  Micro Results Recent Results (from the past 240 hour(s))  Urine culture     Status: Abnormal (Preliminary result)   Collection Time: 08/04/15 10:45 AM  Result Value  Ref Range Status   Specimen  Description URINE, CATHETERIZED  Final   Special Requests NONE  Final   Culture >=100,000 COLONIES/mL ESCHERICHIA COLI (A)  Final   Report Status PENDING  Incomplete    Radiology Reports Dg Chest 2 View  08/04/2015  CLINICAL DATA:  Three falls over the past 6 our spurs. Possible syncope. EXAM: CHEST  2 VIEW COMPARISON:  01/05/2015 FINDINGS: Small pleural effusions bilaterally. Bronchitic or congestive interstitial coarsening. Chronic cardiomegaly and aortic tortuosity. History of aortic stenosis with known bulky aortic valve calcification. Partly visible remote lower thoracic compression fracture. Remote upper sternum fracture, healed. No acute fracture identified. IMPRESSION: 1. Cardiomegaly and small pleural effusions. Interstitial coarsening could be congestive or bronchitic. 2. Chronic hyperinflation. Electronically Signed   By: Marnee Spring M.D.   On: 08/04/2015 11:53   Dg Pelvis 1-2 Views  08/04/2015  CLINICAL DATA:  83 year old female with multiple recent falls. Hip and pelvis pain. Initial encounter. EXAM: PELVIS - 1-2 VIEW COMPARISON:  Pelvis series 16109. FINDINGS: Femoral heads are normally located. Hip joint spaces are stable and normal for age. Proximal femurs appear grossly stable and intact. Osteopenia. Bone detail of the pelvis also obscured by abundant bowel gas in nondilated loops. No acute pelvic fracture identified. Partially visible advanced lower lumbar disc and endplate degeneration. Calcified femoral artery atherosclerosis. IMPRESSION: No acute fracture or dislocation identified about the pelvis. If there is lateralizing hip pain then recommend dedicated hip series. Electronically Signed   By: Odessa Fleming M.D.   On: 08/04/2015 11:52   Dg Wrist Complete Right  08/04/2015  CLINICAL DATA:  Multifocal pain after multiple falls today. Initial encounter. EXAM: RIGHT WRIST - COMPLETE 3+ VIEW COMPARISON:  None. FINDINGS: The bones are demineralized. There is no evidence of acute fracture  or dislocation. Degenerative changes are present at the first [facility] articulation. There is prominent chondrocalcinosis of the TFCC. Mild dorsal soft tissue swelling noted. IMPRESSION: No evidence of acute osseous injury at the right wrist. Degenerative changes as described. Electronically Signed   By: Carey Bullocks M.D.   On: 08/04/2015 11:53   Ct Head Wo Contrast  08/04/2015  CLINICAL DATA:  Fall. EXAM: CT HEAD WITHOUT CONTRAST CT CERVICAL SPINE WITHOUT CONTRAST TECHNIQUE: Multidetector CT imaging of the head and cervical spine was performed following the standard protocol without intravenous contrast. Multiplanar CT image reconstructions of the cervical spine were also generated. COMPARISON:  Brain MRI 06/12/2011 FINDINGS: CT HEAD FINDINGS Skull and Sinuses:Negative for acute fracture. Chronic sinusitis on the left with complete opacification of the left maxillary, ethmoid, and frontal sinuses. Left anterior and posterior ethmoid air cells are expanded with bony thinning along the left orbit and anterior cranial fossa, consistent with mucocele. The left frontal sinus is also expanded, best seen along the intersinus septum. A left Onodi cell is expanded and partially fills the left sphenoid sinus, which is otherwise patent. Bony sclerosis attributed to chronic inflammation. No acute soft tissue or orbital inflammation is noted. Patient is at risk for orbital or intracranial complication. Chronic right mastoiditis with subtotal opacification and clear nasopharynx. Brain: No evidence of acute infarction, hemorrhage, hydrocephalus, or mass lesion/mass effect. Chronic small vessel disease with confluent ischemic gliosis in the cerebral white matter. Mild generalized atrophy with progression since previous. Interval but chronic appearing left thalamus lacunar infarct. CT CERVICAL SPINE FINDINGS Remote appearing T1 and T2 superior endplate fractures. No acute fracture suspected. No traumatic malalignment. Diffuse disc  narrowing with endplate ridging. Multilevel facet arthropathy  with mild retrolisthesis at C3-4 and C4-5. Prominent dorsal ligamentous ossification, especially at C5-6, with canal stenosis and presumed cord mass effect. Septal thickening at the apices attributed to pulmonary edema given earlier chest x-ray. IMPRESSION: 1. No evidence of acute intracranial or cervical spine injury. 2. 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 3. Disc degeneration and dorsal ligamentous ossification with moderate canal stenosis at C4-5 and C5-6. 4. Chronic microvascular disease and mild atrophy with progression since 2013. 5. Mild pulmonary edema at the apices. Electronically Signed   By: Marnee SpringJonathon  Watts M.D.   On: 08/04/2015 12:29   Ct Cervical Spine Wo Contrast  08/04/2015  CLINICAL DATA:  Fall. EXAM: CT HEAD WITHOUT CONTRAST CT CERVICAL SPINE WITHOUT CONTRAST TECHNIQUE: Multidetector CT imaging of the head and cervical spine was performed following the standard protocol without intravenous contrast. Multiplanar CT image reconstructions of the cervical spine were also generated. COMPARISON:  Brain MRI 06/12/2011 FINDINGS: CT HEAD FINDINGS Skull and Sinuses:Negative for acute fracture. Chronic sinusitis on the left with complete opacification of the left maxillary, ethmoid, and frontal sinuses. Left anterior and posterior ethmoid air cells are expanded with bony thinning along the left orbit and anterior cranial fossa, consistent with mucocele. The left frontal sinus is also expanded, best seen along the intersinus septum. A left Onodi cell is expanded and partially fills the left sphenoid sinus, which is otherwise patent. Bony sclerosis attributed to chronic inflammation. No acute soft tissue or orbital inflammation is noted. Patient is at risk for orbital or intracranial complication. Chronic right mastoiditis with subtotal opacification and clear nasopharynx. Brain: No evidence of acute  infarction, hemorrhage, hydrocephalus, or mass lesion/mass effect. Chronic small vessel disease with confluent ischemic gliosis in the cerebral white matter. Mild generalized atrophy with progression since previous. Interval but chronic appearing left thalamus lacunar infarct. CT CERVICAL SPINE FINDINGS Remote appearing T1 and T2 superior endplate fractures. No acute fracture suspected. No traumatic malalignment. Diffuse disc narrowing with endplate ridging. Multilevel facet arthropathy with mild retrolisthesis at C3-4 and C4-5. Prominent dorsal ligamentous ossification, especially at C5-6, with canal stenosis and presumed cord mass effect. Septal thickening at the apices attributed to pulmonary edema given earlier chest x-ray. IMPRESSION: 1. No evidence of acute intracranial or cervical spine injury. 2. 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 3. Disc degeneration and dorsal ligamentous ossification with moderate canal stenosis at C4-5 and C5-6. 4. Chronic microvascular disease and mild atrophy with progression since 2013. 5. Mild pulmonary edema at the apices. Electronically Signed   By: Marnee SpringJonathon  Watts M.D.   On: 08/04/2015 12:29   Dg Knee Complete 4 Views Left  08/04/2015  CLINICAL DATA:  82 year old female with multiple recent falls. Pain. Initial encounter. EXAM: LEFT KNEE - COMPLETE 4+ VIEW COMPARISON:  Left knee series 10 10/10/2003. FINDINGS: Osteopenia. Chronic posterior calcified loose bodies. Chronic moderate to severe medial compartment joint space loss and degenerative spurring. Progressed lateral compartment chondrocalcinosis. Chronic severe patellofemoral compartment degenerative spurring. No joint effusion identified on cross-table lateral. No acute fracture or dislocation identified. IMPRESSION: 1. Osteopenia. No acute fracture or dislocation identified about the left knee. 2. Advanced chronic left knee joint degeneration, with progressed Chondrocalcinosis  since 2005 which can be seen in the setting of calcium pyrophosphate deposition disease. Electronically Signed   By: Odessa FlemingH  Hall M.D.   On: 08/04/2015 11:54   Dg Knee Complete 4 Views Right  08/04/2015  CLINICAL DATA:  Right knee pain after multiple falls. Initial  encounter. EXAM: RIGHT KNEE - COMPLETE 4+ VIEW COMPARISON:  None. FINDINGS: No evidence of fracture, dislocation, or joint effusion. Mild narrowing of the medial and lateral joint spaces is noted with chondrocalcinosis present laterally. Soft tissues are unremarkable. IMPRESSION: Mild degenerative joint disease. No acute abnormality seen in the right knee. Electronically Signed   By: Lupita Raider, M.D.   On: 08/04/2015 11:58   Dg Hand Complete Right  08/04/2015  CLINICAL DATA:  Multifocal pain after multiple falls today. Initial encounter. EXAM: RIGHT HAND - COMPLETE 3+ VIEW COMPARISON:  None. FINDINGS: The bones are diffusely demineralized. There is no evidence of acute fracture or dislocation. There are mild diffuse interphalangeal degenerative changes. Lesser degenerative changes are present at the MCP joints and first [facility] joint. Wrist findings otherwise dictated separately. IMPRESSION: No acute osseous findings in the hand. Osteopenia and diffuse degenerative changes. Electronically Signed   By: Carey Bullocks M.D.   On: 08/04/2015 11:52    Time Spent in minutes  25   Eddie North M.D on 08/05/2015 at 2:35 PM  Between 7am to 7pm - Pager - [phone]  After 7pm go to [email or web address] - password [facility]  [facility] -  Office  [phone]

## 2015-08-05 NOTE — Evaluation (Signed)
Physical Therapy Evaluation Patient Details Name: Karilyn CotaDorothy B Pangborn MRN: [id number] DOB: 11/29/1923 Today's Date: 08/05/2015   History of Present Illness  82 yo female with onset of UTI and issues with hyperventilation of lungs, cervical spine stenosis with possible cord involvement and osteopenia, OA knees.  PMHx:  cardiomegaly, MI, PVC  Clinical Impression  Pt is unable to move without support and assistance, not a home care level pt unless total care can be arranged.  Will anticipate that SNF is needed to ensure recovery of her mobility and for safety of fall prevention and skin protection.  Pt cannot reposition herself now.  Follow acutely to increase standing and strength in LE's.    Follow Up Recommendations SNF    Equipment Recommendations  None recommended by PT    Recommendations for Other Services Rehab consult     Precautions / Restrictions Precautions Precautions: Fall;Other (comment) (has possible cord involvement with her cervical spine issue) Precaution Comments: no abnormal tone noted in legs or arms Restrictions Weight Bearing Restrictions: No      Mobility  Bed Mobility Overal bed mobility: Needs Assistance Bed Mobility: Supine to Sit;Sit to Supine     Supine to sit: Mod assist Sit to supine: Mod assist      Transfers Overall transfer level: Needs assistance Equipment used: 1 person hand held assist Transfers: Sit to/from Stand Sit to Stand: Max assist;From elevated surface (for partial standing)         General transfer comment: Pt is not strong enough to use a RW yet  Ambulation/Gait             General Gait Details: unable  Stairs            Wheelchair Mobility    Modified Rankin (Stroke Patients Only)       Balance Overall balance assessment: Needs assistance Sitting-balance support: Feet supported;Bilateral upper extremity supported Sitting balance-Leahy Scale: Poor       Standing balance-Leahy Scale: Zero                               Pertinent Vitals/Pain Pain Assessment: Faces Faces Pain Scale: Hurts even more Pain Location: neck when repositioning in bed Pain Descriptors / Indicators: Aching;Headache Pain Intervention(s): Limited activity within patient's tolerance;Monitored during session;Repositioned    Home Living Family/patient expects to be discharged to:: Unsure                      Prior Function Level of Independence: Needs assistance   Gait / Transfers Assistance Needed: ambulated with cane.  Has h/o falls per CM  ADL's / Homemaking Assistance Needed: Pt reports grandson assisted her into bathtub, but she bathed and dressed herself.  Family not present to confirm        Hand Dominance   Dominant Hand: Right    Extremity/Trunk Assessment   Upper Extremity Assessment: Generalized weakness           Lower Extremity Assessment: Generalized weakness      Cervical / Trunk Assessment: Kyphotic  Communication   Communication: HOH  Cognition Arousal/Alertness: Awake/alert Behavior During Therapy: WFL for tasks assessed/performed Overall Cognitive Status: Within Functional Limits for tasks assessed                      General Comments General comments (skin integrity, edema, etc.): Pt is extremely weak and not able to assist much with movement.  Despite her status of admission she is a candidate for SNF level of care, and will need to be fully assisted for all bed mob and transfers, non ambulatory at the present.    Exercises        Assessment/Plan    PT Assessment Patient needs continued PT services  PT Diagnosis Generalized weakness   PT Problem List Decreased strength;Decreased range of motion;Decreased activity tolerance;Decreased balance;Decreased mobility;Decreased coordination;Decreased cognition;Decreased knowledge of use of DME;Decreased knowledge of precautions;Decreased safety awareness;Cardiopulmonary status limiting  activity;Decreased skin integrity;Pain  PT Treatment Interventions DME instruction;Gait training;Functional mobility training;Therapeutic activities;Therapeutic exercise;Balance training;Neuromuscular re-education;Patient/family education   PT Goals (Current goals can be found in the Care Plan section) Acute Rehab PT Goals Patient Stated Goal: to feel better and stronger PT Goal Formulation: With patient Time For Goal Achievement: 08/19/15 Potential to Achieve Goals: Good    Frequency Min 3X/week   Barriers to discharge Decreased caregiver support grandson not home at all times per pt    Co-evaluation               End of Session   Activity Tolerance: Patient limited by fatigue;Patient limited by lethargy;Patient limited by pain Patient left: in bed;with call bell/phone within reach;with bed alarm set Nurse Communication: Mobility status         Time: 1610-9604 PT Time Calculation (min) (ACUTE ONLY): 17 min   Charges:   PT Evaluation $PT Eval Low Complexity: 1 Procedure     PT G Codes:        Ivar Drape 08/09/2015, 1:26 PM   Samul Dada, PT MS Acute Rehab Dept. Number: [facility] [id number] and [facility] [phone]

## 2015-08-05 NOTE — Care Management Obs Status (Signed)
MEDICARE OBSERVATION STATUS NOTIFICATION   Patient Details  Name: Natalie Arnold MRN: [id number] Date of Birth: 04/04/1923   Medicare Observation Status Notification Given:  Yes  Pt unable to understand or discuss OB notification , information left in pt room for family with note and CM phone number to call for questions.   Jehiel Koepp, Annamarie MajorCheryl U, RN 08/05/2015, 11:24 AM

## 2015-08-05 NOTE — Progress Notes (Signed)
Initial Nutrition Assessment  DOCUMENTATION CODES:   Severe malnutrition in context of chronic illness, Underweight  INTERVENTION:  Continue Ensure Enlive po BID, each supplement provides 350 kcal and 20 grams of protein.  Encourage adequate PO intake.   NUTRITION DIAGNOSIS:   Malnutrition related to chronic illness as evidenced by severe depletion of body fat, severe depletion of muscle mass.  GOAL:   Patient will meet greater than or equal to 90% of their needs  MONITOR:   PO intake, Supplement acceptance, Weight trends, Labs, I & O's  REASON FOR ASSESSMENT:   Consult Assessment of nutrition requirement/status  ASSESSMENT:   82 year old female with history of hypertension, PE, CHF, aortic stenosis, polymyalgia rheumatica presented with frequent falls and found to have UTI along with acute on chronic CHF.  Pt reports having a good appetite currently. Meal completion has been however 25%. Pt reports she usually consumes 1-2 meals a day at home and consumes Ensure daily. Pt unable to report how many bottles of Ensure she consumes daily. No family at bedside. Usual body weight unknown to pt. Weight stable per Epic weight records. Pt currently has Ensure ordered and has been consuming them. RD to continue with current orders. Pt encouraged to eat her food at meals.  Nutrition-Focused physical exam completed. Findings are severe fat depletion, severe muscle depletion, and no edema.   Labs and medications reviewed.   Diet Order:  Diet Heart Room service appropriate?: Yes; Fluid consistency:: Thin  Skin:  Reviewed, no issues  Last BM:  Unknown  Height:   Ht Readings from Last 1 Encounters:  01/08/15 5\' 7"  (1.702 m)    Weight:   Wt Readings from Last 1 Encounters:  08/04/15 112 lb (50.803 kg)    Ideal Body Weight:  61.36 kg  BMI:  Body mass index is 17.54 kg/(m^2).  Estimated Nutritional Needs:   Kcal:  1400-1600  Protein:  60-70 grams  Fluid:  >/= 1.5  L/day  EDUCATION NEEDS:   No education needs identified at this time  Roslyn SmilingStephanie Shiela Bruns, MS, RD, LDN Pager # [phone] After hours/ weekend pager # [phone]

## 2015-08-06 DIAGNOSIS — I5033 Acute on chronic diastolic (congestive) heart failure: Secondary | ICD-10-CM | POA: Diagnosis not present

## 2015-08-06 DIAGNOSIS — N3000 Acute cystitis without hematuria: Secondary | ICD-10-CM | POA: Insufficient documentation

## 2015-08-06 DIAGNOSIS — E43 Unspecified severe protein-calorie malnutrition: Secondary | ICD-10-CM

## 2015-08-06 DIAGNOSIS — I10 Essential (primary) hypertension: Secondary | ICD-10-CM | POA: Diagnosis not present

## 2015-08-06 DIAGNOSIS — N390 Urinary tract infection, site not specified: Secondary | ICD-10-CM | POA: Diagnosis not present

## 2015-08-06 LAB — BASIC METABOLIC PANEL
Anion gap: 10 (ref 5–15)
BUN: 33 mg/dL — ABNORMAL HIGH (ref 6–20)
CALCIUM: 8.9 mg/dL (ref 8.9–10.3)
CO2: 31 mmol/L (ref 22–32)
CREATININE: 0.99 mg/dL (ref 0.44–1.00)
Chloride: 100 mmol/L — ABNORMAL LOW (ref 101–111)
GFR, EST AFRICAN AMERICAN: 56 mL/min — AB (ref >60)
GFR, EST NON AFRICAN AMERICAN: 49 mL/min — AB (ref >60)
Glucose, Bld: 109 mg/dL — ABNORMAL HIGH (ref 65–99)
Potassium: 3.6 mmol/L (ref 3.5–5.1)
SODIUM: 141 mmol/L (ref 135–145)

## 2015-08-06 LAB — URINE CULTURE: Culture: >=100000 — AB

## 2015-08-06 MED ORDER — CEPHALEXIN 500 MG PO CAPS: 500.0000 mg | ORAL_CAPSULE | Freq: Four times a day (QID) | ORAL | Status: AC

## 2015-08-06 NOTE — Progress Notes (Signed)
PROGRESS NOTE                                                                                                                                                                                                             Patient Demographics:    Natalie Arnold, is a 82 y.o. female, DOB - May 25, 1923, ZOX:[id number]  Admit date - 08/04/2015   Admitting Physician Ozella Rocks, MD  Outpatient Primary MD for the patient is Ginette Otto, MD  LOS -   Outpatient Specialists: None  Chief Complaint  Patient presents with  . Fall       Brief Narrative   82 year old female with history of hypertension, PE, CHF, aortic stenosis, polymyalgia rheumatica presented with frequent falls and found to have UTI along with acute on chronic CHF.   Subjective:   Overnight issues.   Assessment  & Plan :    Principal Problem:   UTI (lower urinary tract infection) Empiric Rocephin. Urine culture growing Escherichia coli. Pansensitive  Active Problems:   Acute on chronic diastolic congestive heart failure (HCC) Elevated proBNP. Chest x-ray showing small pleural effusion. IV Lasix. Appears euvolemic clinically. Strict I/O and daily weight. (Output not charted likely because patient is incontinent). 2-D echo from 11/16 with normal EF, moderate to severe MR and elevated PA pressure.  Recurrent falls with physical deconditioning and severe protein calorie malnutrition  PT  evaluation coming skilled nursing facility.. Continue nutrition supplement.  AKI Mild possibly due to IV diuresis. Hold ACE inhibitor and monitor.  Essential hypertension Continue home medications  Mild-to-moderate dementia Stable.  Normocytic anemia Stable.    Code Status : DO NOT RESUSCITATE  Family Communication  : None at bedside  Disposition Plan  : SNF for PT, and be discharged in a.m. Social work consulted.  Barriers For Discharge :  SNF  Consults  : None  Procedures  : CT head and cervical spine  DVT Prophylaxis  :  Lovenox -  Lab Results  Component Value Date   PLT 139* 08/05/2015    Antibiotics  :    Anti-infectives    Start     Dose/Rate Route Frequency Ordered Stop   08/05/15 1200  cefTRIAXone (ROCEPHIN) 1 g in dextrose 5 % 50 mL IVPB     1 g 100 mL/hr over 30 Minutes Intravenous Every 24 hours  08/04/15 1351     08/04/15 1200  cefTRIAXone (ROCEPHIN) 1 g in dextrose 5 % 50 mL IVPB     1 g 100 mL/hr over 30 Minutes Intravenous  Once 08/04/15 1145 08/04/15 1300        Objective:   Filed Vitals:   08/05/15 2012 08/06/15 0222 08/06/15 0442 08/06/15 0726  BP: 95/53  100/60 92/46  Pulse: 78  75 69  Temp: 99 F (37.2 C)  98.6 F (37 C) 98.6 F (37 C)  TempSrc: Oral  Oral Oral  Resp: 16  16 18   Weight:  52.2 kg (115 lb 1.3 oz)    SpO2: 98%  100% 100%    Wt Readings from Last 3 Encounters:  08/06/15 52.2 kg (115 lb 1.3 oz)  01/09/15 51.98 kg (114 lb 9.5 oz)     Intake/Output Summary (Last 24 hours) at 08/06/15 1443 Last data filed at 08/06/15 1442  Gross per 24 hour  Intake    720 ml  Output      0 ml  Net    720 ml     Physical Exam  Gen:  not in distress HEENT: , moist mucosa, supple neck, no JVD Chest: Bibasilar crackles, respiratory rhonchi on right CVS: N S1&S2,syastolic murmur 3/6, no rubs or gallop GI: soft, NT, ND, BS+ Musculoskeletal: warm, no edema CNS: AAOX1-2, non focal    Data Review:    CBC  Recent Labs Lab 08/04/15 1210 08/05/15 0520  WBC 7.8 9.2  HGB 9.8* 9.0*  HCT 30.9* 28.5*  PLT 129* 139*  MCV 100.0 98.3  MCH 31.7 31.0  MCHC 31.7 31.6  RDW 14.6 14.5  LYMPHSABS 0.8  --   MONOABS 0.4  --   EOSABS 0.1  --   BASOSABS 0.0  --     Chemistries   Recent Labs Lab 08/04/15 1210 08/05/15 0520 08/06/15 0703  NA 139 138 141  K 4.5 3.6 3.6  CL 103 98* 100*  CO2 27 31 31   GLUCOSE 136* 148* 109*  BUN 24* 28* 33*  CREATININE 0.91 1.29* 0.99   CALCIUM 8.8* 8.5* 8.9   ------------------------------------------------------------------------------------------------------------------ No results for input(s): CHOL, HDL, LDLCALC, TRIG, CHOLHDL, LDLDIRECT in the last 72 hours.  Lab Results  Component Value Date   HGBA1C 5.3 01/06/2015   ------------------------------------------------------------------------------------------------------------------ No results for input(s): TSH, T4TOTAL, T3FREE, THYROIDAB in the last 72 hours.  Invalid input(s): FREET3 ------------------------------------------------------------------------------------------------------------------ No results for input(s): VITAMINB12, FOLATE, FERRITIN, TIBC, IRON, RETICCTPCT in the last 72 hours.  Coagulation profile  Recent Labs Lab 08/04/15 1210  INR 1.30    No results for input(s): DDIMER in the last 72 hours.  Cardiac Enzymes  Recent Labs Lab 08/04/15 1210  TROPONINI 0.03   ------------------------------------------------------------------------------------------------------------------    Component Value Date/Time   BNP 1223.1* 08/04/2015 1210    Inpatient Medications  Scheduled Meds: . aspirin  81 mg Oral Daily  . calcium-vitamin D  2 tablet Oral Daily  . cefTRIAXone (ROCEPHIN)  IV  1 g Intravenous Q24H  . cholecalciferol  2,000 Units Oral Daily  . enoxaparin (LOVENOX) injection  40 mg Subcutaneous Q24H  . feeding supplement (ENSURE ENLIVE)  237 mL Oral BID BM  . furosemide  40 mg Intravenous Daily  . multivitamin with minerals  1 tablet Oral Daily  . sodium chloride flush  3 mL Intravenous Q12H   Continuous Infusions:  PRN Meds:.HYDROcodone-acetaminophen, magnesium hydroxide, meclizine, nitroGLYCERIN, ondansetron **OR** ondansetron (ZOFRAN) IV, senna-docusate  Micro Results Recent Results (from the  past 240 hour(s))  Urine culture     Status: Abnormal   Collection Time: 08/04/15 10:45 AM  Result Value Ref Range Status    Specimen Description URINE, CATHETERIZED  Final   Special Requests NONE  Final   Culture >=100,000 COLONIES/mL ESCHERICHIA COLI (A)  Final   Report Status 08/06/2015 FINAL  Final   Organism ID, Bacteria ESCHERICHIA COLI (A)  Final      Susceptibility   Escherichia coli - MIC*    AMPICILLIN <=2 SENSITIVE Sensitive     CEFAZOLIN <=4 SENSITIVE Sensitive     CEFTRIAXONE <=1 SENSITIVE Sensitive     CIPROFLOXACIN <=0.25 SENSITIVE Sensitive     GENTAMICIN <=1 SENSITIVE Sensitive     IMIPENEM <=0.25 SENSITIVE Sensitive     NITROFURANTOIN <=16 SENSITIVE Sensitive     TRIMETH/SULFA <=20 SENSITIVE Sensitive     AMPICILLIN/SULBACTAM <=2 SENSITIVE Sensitive     PIP/TAZO <=4 SENSITIVE Sensitive     * >=100,000 COLONIES/mL ESCHERICHIA COLI    Radiology Reports Dg Chest 2 View  08/04/2015  CLINICAL DATA:  Three falls over the past 6 our spurs. Possible syncope. EXAM: CHEST  2 VIEW COMPARISON:  01/05/2015 FINDINGS: Small pleural effusions bilaterally. Bronchitic or congestive interstitial coarsening. Chronic cardiomegaly and aortic tortuosity. History of aortic stenosis with known bulky aortic valve calcification. Partly visible remote lower thoracic compression fracture. Remote upper sternum fracture, healed. No acute fracture identified. IMPRESSION: 1. Cardiomegaly and small pleural effusions. Interstitial coarsening could be congestive or bronchitic. 2. Chronic hyperinflation. Electronically Signed   By: Marnee Spring M.D.   On: 08/04/2015 11:53   Dg Pelvis 1-2 Views  08/04/2015  CLINICAL DATA:  82 year old female with multiple recent falls. Hip and pelvis pain. Initial encounter. EXAM: PELVIS - 1-2 VIEW COMPARISON:  Pelvis series 16109. FINDINGS: Femoral heads are normally located. Hip joint spaces are stable and normal for age. Proximal femurs appear grossly stable and intact. Osteopenia. Bone detail of the pelvis also obscured by abundant bowel gas in nondilated loops. No acute pelvic fracture  identified. Partially visible advanced lower lumbar disc and endplate degeneration. Calcified femoral artery atherosclerosis. IMPRESSION: No acute fracture or dislocation identified about the pelvis. If there is lateralizing hip pain then recommend dedicated hip series. Electronically Signed   By: Odessa Fleming M.D.   On: 08/04/2015 11:52   Dg Wrist Complete Right  08/04/2015  CLINICAL DATA:  Multifocal pain after multiple falls today. Initial encounter. EXAM: RIGHT WRIST - COMPLETE 3+ VIEW COMPARISON:  None. FINDINGS: The bones are demineralized. There is no evidence of acute fracture or dislocation. Degenerative changes are present at the first [facility] articulation. There is prominent chondrocalcinosis of the TFCC. Mild dorsal soft tissue swelling noted. IMPRESSION: No evidence of acute osseous injury at the right wrist. Degenerative changes as described. Electronically Signed   By: Carey Bullocks M.D.   On: 08/04/2015 11:53   Ct Head Wo Contrast  08/04/2015  CLINICAL DATA:  Fall. EXAM: CT HEAD WITHOUT CONTRAST CT CERVICAL SPINE WITHOUT CONTRAST TECHNIQUE: Multidetector CT imaging of the head and cervical spine was performed following the standard protocol without intravenous contrast. Multiplanar CT image reconstructions of the cervical spine were also generated. COMPARISON:  Brain MRI 06/12/2011 FINDINGS: CT HEAD FINDINGS Skull and Sinuses:Negative for acute fracture. Chronic sinusitis on the left with complete opacification of the left maxillary, ethmoid, and frontal sinuses. Left anterior and posterior ethmoid air cells are expanded with bony thinning along the left orbit and anterior cranial fossa, consistent with  mucocele. The left frontal sinus is also expanded, best seen along the intersinus septum. A left Onodi cell is expanded and partially fills the left sphenoid sinus, which is otherwise patent. Bony sclerosis attributed to chronic inflammation. No acute soft tissue or orbital inflammation is noted.  Patient is at risk for orbital or intracranial complication. Chronic right mastoiditis with subtotal opacification and clear nasopharynx. Brain: No evidence of acute infarction, hemorrhage, hydrocephalus, or mass lesion/mass effect. Chronic small vessel disease with confluent ischemic gliosis in the cerebral white matter. Mild generalized atrophy with progression since previous. Interval but chronic appearing left thalamus lacunar infarct. CT CERVICAL SPINE FINDINGS Remote appearing T1 and T2 superior endplate fractures. No acute fracture suspected. No traumatic malalignment. Diffuse disc narrowing with endplate ridging. Multilevel facet arthropathy with mild retrolisthesis at C3-4 and C4-5. Prominent dorsal ligamentous ossification, especially at C5-6, with canal stenosis and presumed cord mass effect. Septal thickening at the apices attributed to pulmonary edema given earlier chest x-ray. IMPRESSION: 1. No evidence of acute intracranial or cervical spine injury. 2. 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 3. Disc degeneration and dorsal ligamentous ossification with moderate canal stenosis at C4-5 and C5-6. 4. Chronic microvascular disease and mild atrophy with progression since 2013. 5. Mild pulmonary edema at the apices. Electronically Signed   By: Marnee Spring M.D.   On: 08/04/2015 12:29   Ct Cervical Spine Wo Contrast  08/04/2015  CLINICAL DATA:  Fall. EXAM: CT HEAD WITHOUT CONTRAST CT CERVICAL SPINE WITHOUT CONTRAST TECHNIQUE: Multidetector CT imaging of the head and cervical spine was performed following the standard protocol without intravenous contrast. Multiplanar CT image reconstructions of the cervical spine were also generated. COMPARISON:  Brain MRI 06/12/2011 FINDINGS: CT HEAD FINDINGS Skull and Sinuses:Negative for acute fracture. Chronic sinusitis on the left with complete opacification of the left maxillary, ethmoid, and frontal sinuses. Left anterior  and posterior ethmoid air cells are expanded with bony thinning along the left orbit and anterior cranial fossa, consistent with mucocele. The left frontal sinus is also expanded, best seen along the intersinus septum. A left Onodi cell is expanded and partially fills the left sphenoid sinus, which is otherwise patent. Bony sclerosis attributed to chronic inflammation. No acute soft tissue or orbital inflammation is noted. Patient is at risk for orbital or intracranial complication. Chronic right mastoiditis with subtotal opacification and clear nasopharynx. Brain: No evidence of acute infarction, hemorrhage, hydrocephalus, or mass lesion/mass effect. Chronic small vessel disease with confluent ischemic gliosis in the cerebral white matter. Mild generalized atrophy with progression since previous. Interval but chronic appearing left thalamus lacunar infarct. CT CERVICAL SPINE FINDINGS Remote appearing T1 and T2 superior endplate fractures. No acute fracture suspected. No traumatic malalignment. Diffuse disc narrowing with endplate ridging. Multilevel facet arthropathy with mild retrolisthesis at C3-4 and C4-5. Prominent dorsal ligamentous ossification, especially at C5-6, with canal stenosis and presumed cord mass effect. Septal thickening at the apices attributed to pulmonary edema given earlier chest x-ray. IMPRESSION: 1. No evidence of acute intracranial or cervical spine injury. 2. 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 3. Disc degeneration and dorsal ligamentous ossification with moderate canal stenosis at C4-5 and C5-6. 4. Chronic microvascular disease and mild atrophy with progression since 2013. 5. Mild pulmonary edema at the apices. Electronically Signed   By: Marnee Spring M.D.   On: 08/04/2015 12:29   Dg Knee Complete 4 Views Left  08/04/2015  CLINICAL DATA:  82 year old female with multiple recent  falls. Pain. Initial encounter. EXAM: LEFT KNEE - COMPLETE 4+  VIEW COMPARISON:  Left knee series 10 10/10/2003. FINDINGS: Osteopenia. Chronic posterior calcified loose bodies. Chronic moderate to severe medial compartment joint space loss and degenerative spurring. Progressed lateral compartment chondrocalcinosis. Chronic severe patellofemoral compartment degenerative spurring. No joint effusion identified on cross-table lateral. No acute fracture or dislocation identified. IMPRESSION: 1. Osteopenia. No acute fracture or dislocation identified about the left knee. 2. Advanced chronic left knee joint degeneration, with progressed Chondrocalcinosis since 2005 which can be seen in the setting of calcium pyrophosphate deposition disease. Electronically Signed   By: Odessa FlemingH  Hall M.D.   On: 08/04/2015 11:54   Dg Knee Complete 4 Views Right  08/04/2015  CLINICAL DATA:  Right knee pain after multiple falls. Initial encounter. EXAM: RIGHT KNEE - COMPLETE 4+ VIEW COMPARISON:  None. FINDINGS: No evidence of fracture, dislocation, or joint effusion. Mild narrowing of the medial and lateral joint spaces is noted with chondrocalcinosis present laterally. Soft tissues are unremarkable. IMPRESSION: Mild degenerative joint disease. No acute abnormality seen in the right knee. Electronically Signed   By: Lupita RaiderJames  Green Jr, M.D.   On: 08/04/2015 11:58   Dg Hand Complete Right  08/04/2015  CLINICAL DATA:  Multifocal pain after multiple falls today. Initial encounter. EXAM: RIGHT HAND - COMPLETE 3+ VIEW COMPARISON:  None. FINDINGS: The bones are diffusely demineralized. There is no evidence of acute fracture or dislocation. There are mild diffuse interphalangeal degenerative changes. Lesser degenerative changes are present at the MCP joints and first [facility] joint. Wrist findings otherwise dictated separately. IMPRESSION: No acute osseous findings in the hand. Osteopenia and diffuse degenerative changes. Electronically Signed   By: Carey BullocksWilliam  Veazey M.D.   On: 08/04/2015 11:52    Time Spent in minutes   25   Eddie NorthHUNGEL, Allyana Vogan M.D on 08/06/2015 at 2:43 PM  Between 7am to 7pm - Pager - [phone]  After 7pm go to [email or web address] - password [email or web address]  [facility] -  Office  [phone]

## 2015-08-06 NOTE — Discharge Summary (Signed)
Physician Discharge Summary  Natalie Arnold ZOX:[id number] DOB: 15-Oct-1923 DOA: 08/04/2015  PCP: Ginette Otto, MD  Admit date: 08/04/2015 Discharge date: 08/06/2015  Admitted From: HOME Disposition:  HOME  Recommendations for Outpatient Follow-up:  1. Follow up with PCP in 1-2 weeks. Com,pletes 7 days abx course on 08/11/2015  Home Health:PT/RN Equipment/Devices: walker  Discharge Condition: Stable CODE STATUS: DNR Diet recommendation: Heart Healthy   Discharge Diagnoses:  Principal Problem:   UTI (lower urinary tract infection)   Active Problems:   Acute on chronic diastolic  heart failure (HCC)   Essential hypertension   Memory loss   Aortic stenosis, severe   Macrocytic anemia   Fall at home   Physical deconditioning   Protein-calorie malnutrition, severe  HPI/ brief narrative 82 year old female with history of hypertension, PE, CHF, aortic stenosis, polymyalgia rheumatica presented with frequent falls and found to have UTI along with acute on chronic CHF. Imaging in the ED including CT head adn cervical spine and xrays of limbs negative for fracture or acute injury.  Hospital course  Principal Problem:  UTI (lower urinary tract infection) Empiric Rocephin. Urine culture growing Escherichia coli. Pansensitive. Will discharge on keflex to complete 7 days course.  Active Problems:  Acute on chronic diastolic congestive heart failure (HCC) Elevated proBNP. Chest x-ray showing small pleural effusion. IV Lasix. Appears euvolemic clinically. Strict I/O and daily weight. (Output not charted likely because patient is incontinent). 2-D echo from 11/16 with normal EF, moderate to severe MR and elevated PA pressure. Resume oral lasix on discharge.   Recurrent falls with physical deconditioning and severe protein calorie malnutrition PT evaluation coming skilled nursing facility.[name] wants to take her home with services and will provide full supervision.  Arrange HHRN and PT. Continue nutrition supplement.  AKI Mild possibly due to IV diuresis. Now stable. Resume ACEi  Essential hypertension Continue home medications  Mild-to-moderate dementia Stable.  Normocytic anemia Stable.    Code Status : DO NOT RESUSCITATE  Family Communication : spoke with grandson Natalie Arnold on the phone  Disposition Plan : HHPT/ Charity fundraiser . Grandson wished to take pt home ( was recommended SNF by PT)    Consults : None  Procedures : CT head and cervical spine  Discharge Instructions     Medication List    TAKE these medications        aspirin 81 MG chewable tablet  Chew 1 tablet (81 mg total) by mouth daily.     CALCIUM 600+D 600-400 MG-UNIT tablet  Generic drug:  Calcium Carbonate-Vitamin D  Take 2 tablets by mouth daily.     cephALEXin 500 MG capsule  Commonly known as:  KEFLEX  Take 1 capsule (500 mg total) by mouth 4 (four) times daily.  Start taking on:  08/07/2015     feeding supplement (ENSURE ENLIVE) Liqd  Take 237 mLs by mouth 2 (two) times daily between meals.     furosemide 40 MG tablet  Commonly known as:  LASIX  Take 1 tablet (40 mg total) by mouth daily.     HYDROcodone-acetaminophen 5-325 MG tablet  Commonly known as:  NORCO/VICODIN  Take 1 tablet by mouth every 6 (six) hours as needed for moderate pain.     lisinopril 5 MG tablet  Commonly known as:  PRINIVIL,ZESTRIL  Take 1 tablet (5 mg total) by mouth daily.     LORazepam 1 MG tablet  Commonly known as:  ATIVAN  Take 0.5 mg by mouth 2 (two) times daily as needed  for anxiety.     magnesium hydroxide 400 MG/5ML suspension  Commonly known as:  MILK OF MAGNESIA  Take 30 mLs by mouth daily as needed for mild constipation.     meclizine 12.5 MG tablet  Commonly known as:  ANTIVERT  Take 12.5 mg by mouth as needed for dizziness.     multivitamin tablet  Take 1 tablet by mouth daily.     nitroGLYCERIN 0.4 MG SL tablet  Commonly known as:  NITROSTAT  Place 1  tablet (0.4 mg total) under the tongue every 5 (five) minutes as needed for chest pain.     sennosides-docusate sodium 8.6-50 MG tablet  Commonly known as:  SENOKOT-S  Take 1 tablet by mouth daily as needed for constipation.      ASK your doctor about these medications        cholecalciferol 1000 units tablet  Commonly known as:  VITAMIN D  Take 2,000 Units by mouth daily.           Follow-up Information    Follow up with Ginette OttoSTONEKING,HAL THOMAS, MD. Schedule an appointment as soon as possible for a visit in 1 week.   Specialty:  Internal Medicine   Contact information:   [address] [place] [address] [phone]      Allergies  Allergen Reactions  . Ultracet [Tramadol-Acetaminophen] Other (See Comments)    confusion  . Penicillins Rash     Procedures/Studies: Dg Chest 2 View  08/04/2015  CLINICAL DATA:  Three falls over the past 6 our spurs. Possible syncope. EXAM: CHEST  2 VIEW COMPARISON:  01/05/2015 FINDINGS: Small pleural effusions bilaterally. Bronchitic or congestive interstitial coarsening. Chronic cardiomegaly and aortic tortuosity. History of aortic stenosis with known bulky aortic valve calcification. Partly visible remote lower thoracic compression fracture. Remote upper sternum fracture, healed. No acute fracture identified. IMPRESSION: 1. Cardiomegaly and small pleural effusions. Interstitial coarsening could be congestive or bronchitic. 2. Chronic hyperinflation. Electronically Signed   By: Marnee SpringJonathon  Watts M.D.   On: 08/04/2015 11:53   Dg Pelvis 1-2 Views  08/04/2015  CLINICAL DATA:  82 year old female with multiple recent falls. Hip and pelvis pain. Initial encounter. EXAM: PELVIS - 1-2 VIEW COMPARISON:  Pelvis series [id number]. FINDINGS: Femoral heads are normally located. Hip joint spaces are stable and normal for age. Proximal femurs appear grossly stable and intact. Osteopenia. Bone detail of the pelvis also obscured by abundant bowel gas in  nondilated loops. No acute pelvic fracture identified. Partially visible advanced lower lumbar disc and endplate degeneration. Calcified femoral artery atherosclerosis. IMPRESSION: No acute fracture or dislocation identified about the pelvis. If there is lateralizing hip pain then recommend dedicated hip series. Electronically Signed   By: Odessa FlemingH  Hall M.D.   On: 08/04/2015 11:52   Dg Wrist Complete Right  08/04/2015  CLINICAL DATA:  Multifocal pain after multiple falls today. Initial encounter. EXAM: RIGHT WRIST - COMPLETE 3+ VIEW COMPARISON:  None. FINDINGS: The bones are demineralized. There is no evidence of acute fracture or dislocation. Degenerative changes are present at the first [facility] articulation. There is prominent chondrocalcinosis of the TFCC. Mild dorsal soft tissue swelling noted. IMPRESSION: No evidence of acute osseous injury at the right wrist. Degenerative changes as described. Electronically Signed   By: Carey BullocksWilliam  Veazey M.D.   On: 08/04/2015 11:53   Ct Head Wo Contrast  08/04/2015  CLINICAL DATA:  Fall. EXAM: CT HEAD WITHOUT CONTRAST CT CERVICAL SPINE WITHOUT CONTRAST TECHNIQUE: Multidetector CT imaging of the head  and cervical spine was performed following the standard protocol without intravenous contrast. Multiplanar CT image reconstructions of the cervical spine were also generated. COMPARISON:  Brain MRI 06/12/2011 FINDINGS: CT HEAD FINDINGS Skull and Sinuses:Negative for acute fracture. Chronic sinusitis on the left with complete opacification of the left maxillary, ethmoid, and frontal sinuses. Left anterior and posterior ethmoid air cells are expanded with bony thinning along the left orbit and anterior cranial fossa, consistent with mucocele. The left frontal sinus is also expanded, best seen along the intersinus septum. A left Onodi cell is expanded and partially fills the left sphenoid sinus, which is otherwise patent. Bony sclerosis attributed to chronic inflammation. No acute soft  tissue or orbital inflammation is noted. Patient is at risk for orbital or intracranial complication. Chronic right mastoiditis with subtotal opacification and clear nasopharynx. Brain: No evidence of acute infarction, hemorrhage, hydrocephalus, or mass lesion/mass effect. Chronic small vessel disease with confluent ischemic gliosis in the cerebral white matter. Mild generalized atrophy with progression since previous. Interval but chronic appearing left thalamus lacunar infarct. CT CERVICAL SPINE FINDINGS Remote appearing T1 and T2 superior endplate fractures. No acute fracture suspected. No traumatic malalignment. Diffuse disc narrowing with endplate ridging. Multilevel facet arthropathy with mild retrolisthesis at C3-4 and C4-5. Prominent dorsal ligamentous ossification, especially at C5-6, with canal stenosis and presumed cord mass effect. Septal thickening at the apices attributed to pulmonary edema given earlier chest x-ray. IMPRESSION: 1. No evidence of acute intracranial or cervical spine injury. 2. 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 3. Disc degeneration and dorsal ligamentous ossification with moderate canal stenosis at C4-5 and C5-6. 4. Chronic microvascular disease and mild atrophy with progression since 2013. 5. Mild pulmonary edema at the apices. Electronically Signed   By: Marnee Spring M.D.   On: 08/04/2015 12:29   Ct Cervical Spine Wo Contrast  08/04/2015  CLINICAL DATA:  Fall. EXAM: CT HEAD WITHOUT CONTRAST CT CERVICAL SPINE WITHOUT CONTRAST TECHNIQUE: Multidetector CT imaging of the head and cervical spine was performed following the standard protocol without intravenous contrast. Multiplanar CT image reconstructions of the cervical spine were also generated. COMPARISON:  Brain MRI 06/12/2011 FINDINGS: CT HEAD FINDINGS Skull and Sinuses:Negative for acute fracture. Chronic sinusitis on the left with complete opacification of the left maxillary,  ethmoid, and frontal sinuses. Left anterior and posterior ethmoid air cells are expanded with bony thinning along the left orbit and anterior cranial fossa, consistent with mucocele. The left frontal sinus is also expanded, best seen along the intersinus septum. A left Onodi cell is expanded and partially fills the left sphenoid sinus, which is otherwise patent. Bony sclerosis attributed to chronic inflammation. No acute soft tissue or orbital inflammation is noted. Patient is at risk for orbital or intracranial complication. Chronic right mastoiditis with subtotal opacification and clear nasopharynx. Brain: No evidence of acute infarction, hemorrhage, hydrocephalus, or mass lesion/mass effect. Chronic small vessel disease with confluent ischemic gliosis in the cerebral white matter. Mild generalized atrophy with progression since previous. Interval but chronic appearing left thalamus lacunar infarct. CT CERVICAL SPINE FINDINGS Remote appearing T1 and T2 superior endplate fractures. No acute fracture suspected. No traumatic malalignment. Diffuse disc narrowing with endplate ridging. Multilevel facet arthropathy with mild retrolisthesis at C3-4 and C4-5. Prominent dorsal ligamentous ossification, especially at C5-6, with canal stenosis and presumed cord mass effect. Septal thickening at the apices attributed to pulmonary edema given earlier chest x-ray. IMPRESSION: 1. No evidence of acute intracranial or cervical spine injury. 2.  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 3. Disc degeneration and dorsal ligamentous ossification with moderate canal stenosis at C4-5 and C5-6. 4. Chronic microvascular disease and mild atrophy with progression since 2013. 5. Mild pulmonary edema at the apices. Electronically Signed   By: Marnee Spring M.D.   On: 08/04/2015 12:29   Dg Knee Complete 4 Views Left  08/04/2015  CLINICAL DATA:  82 year old female with multiple recent falls. Pain.  Initial encounter. EXAM: LEFT KNEE - COMPLETE 4+ VIEW COMPARISON:  Left knee series 10 10/10/2003. FINDINGS: Osteopenia. Chronic posterior calcified loose bodies. Chronic moderate to severe medial compartment joint space loss and degenerative spurring. Progressed lateral compartment chondrocalcinosis. Chronic severe patellofemoral compartment degenerative spurring. No joint effusion identified on cross-table lateral. No acute fracture or dislocation identified. IMPRESSION: 1. Osteopenia. No acute fracture or dislocation identified about the left knee. 2. Advanced chronic left knee joint degeneration, with progressed Chondrocalcinosis since 2005 which can be seen in the setting of calcium pyrophosphate deposition disease. Electronically Signed   By: Odessa Fleming M.D.   On: 08/04/2015 11:54   Dg Knee Complete 4 Views Right  08/04/2015  CLINICAL DATA:  Right knee pain after multiple falls. Initial encounter. EXAM: RIGHT KNEE - COMPLETE 4+ VIEW COMPARISON:  None. FINDINGS: No evidence of fracture, dislocation, or joint effusion. Mild narrowing of the medial and lateral joint spaces is noted with chondrocalcinosis present laterally. Soft tissues are unremarkable. IMPRESSION: Mild degenerative joint disease. No acute abnormality seen in the right knee. Electronically Signed   By: Lupita Raider, M.D.   On: 08/04/2015 11:58   Dg Hand Complete Right  08/04/2015  CLINICAL DATA:  Multifocal pain after multiple falls today. Initial encounter. EXAM: RIGHT HAND - COMPLETE 3+ VIEW COMPARISON:  None. FINDINGS: The bones are diffusely demineralized. There is no evidence of acute fracture or dislocation. There are mild diffuse interphalangeal degenerative changes. Lesser degenerative changes are present at the MCP joints and first [facility] joint. Wrist findings otherwise dictated separately. IMPRESSION: No acute osseous findings in the hand. Osteopenia and diffuse degenerative changes. Electronically Signed   By: Carey Bullocks M.D.    On: 08/04/2015 11:52         Discharge Exam: Filed Vitals:   08/06/15 0442 08/06/15 0726  BP: 100/60 92/46  Pulse: 75 69  Temp: 98.6 F (37 C) 98.6 F (37 C)  Resp: 16 18   Filed Vitals:   08/05/15 2012 08/06/15 0222 08/06/15 0442 08/06/15 0726  BP: 95/53  100/60 92/46  Pulse: 78  75 69  Temp: 99 F (37.2 C)  98.6 F (37 C) 98.6 F (37 C)  TempSrc: Oral  Oral Oral  Resp: 16  16 18   Weight:  52.2 kg (115 lb 1.3 oz)    SpO2: 98%  100% 100%     Gen: not in distress HEENT: , moist mucosa, supple neck, no JVD Chest: Bibasilar crackles, respiratory rhonchi on right CVS: N S1&S2,syastolic murmur 3/6, no rubs or gallop GI: soft, NT, ND, BS+ Musculoskeletal: warm, no edema CNS: AAOX1-2, non focal   The results of significant diagnostics from this hospitalization (including imaging, microbiology, ancillary and laboratory) are listed below for reference.     Microbiology: Recent Results (from the past 240 hour(s))  Urine culture     Status: Abnormal   Collection Time: 08/04/15 10:45 AM  Result Value Ref Range Status   Specimen Description URINE, CATHETERIZED  Final   Special Requests NONE  Final  Culture >=100,000 COLONIES/mL ESCHERICHIA COLI (A)  Final   Report Status 08/06/2015 FINAL  Final   Organism ID, Bacteria ESCHERICHIA COLI (A)  Final      Susceptibility   Escherichia coli - MIC*    AMPICILLIN <=2 SENSITIVE Sensitive     CEFAZOLIN <=4 SENSITIVE Sensitive     CEFTRIAXONE <=1 SENSITIVE Sensitive     CIPROFLOXACIN <=0.25 SENSITIVE Sensitive     GENTAMICIN <=1 SENSITIVE Sensitive     IMIPENEM <=0.25 SENSITIVE Sensitive     NITROFURANTOIN <=16 SENSITIVE Sensitive     TRIMETH/SULFA <=20 SENSITIVE Sensitive     AMPICILLIN/SULBACTAM <=2 SENSITIVE Sensitive     PIP/TAZO <=4 SENSITIVE Sensitive     * >=100,000 COLONIES/mL ESCHERICHIA COLI     Labs: BNP (last 3 results)  Recent Labs  01/05/15 2304 08/04/15 1210  BNP 2380.1* 1223.1*   Basic Metabolic  Panel:  Recent Labs Lab 08/04/15 1210 08/05/15 0520 08/06/15 0703  NA 139 138 141  K 4.5 3.6 3.6  CL 103 98* 100*  CO2 27 31 31   GLUCOSE 136* 148* 109*  BUN 24* 28* 33*  CREATININE 0.91 1.29* 0.99  CALCIUM 8.8* 8.5* 8.9   Liver Function Tests: No results for input(s): AST, ALT, ALKPHOS, BILITOT, PROT, ALBUMIN in the last 168 hours. No results for input(s): LIPASE, AMYLASE in the last 168 hours. No results for input(s): AMMONIA in the last 168 hours. CBC:  Recent Labs Lab 08/04/15 1210 08/05/15 0520  WBC 7.8 9.2  NEUTROABS 6.5  --   HGB 9.8* 9.0*  HCT 30.9* 28.5*  MCV 100.0 98.3  PLT 129* 139*   Cardiac Enzymes:  Recent Labs Lab 08/04/15 1210  TROPONINI 0.03   BNP: Invalid input(s): POCBNP CBG: No results for input(s): GLUCAP in the last 168 hours. D-Dimer No results for input(s): DDIMER in the last 72 hours. Hgb A1c No results for input(s): HGBA1C in the last 72 hours. Lipid Profile No results for input(s): CHOL, HDL, LDLCALC, TRIG, CHOLHDL, LDLDIRECT in the last 72 hours. Thyroid function studies No results for input(s): TSH, T4TOTAL, T3FREE, THYROIDAB in the last 72 hours.  Invalid input(s): FREET3 Anemia work up No results for input(s): VITAMINB12, FOLATE, FERRITIN, TIBC, IRON, RETICCTPCT in the last 72 hours. Urinalysis    Component Value Date/Time   COLORURINE YELLOW 08/04/2015 1045   APPEARANCEUR HAZY* 08/04/2015 1045   LABSPEC 1.023 08/04/2015 1045   PHURINE 5.5 08/04/2015 1045   GLUCOSEU NEGATIVE 08/04/2015 1045   HGBUR SMALL* 08/04/2015 1045   BILIRUBINUR NEGATIVE 08/04/2015 1045   KETONESUR 15* 08/04/2015 1045   PROTEINUR 30* 08/04/2015 1045   NITRITE POSITIVE* 08/04/2015 1045   LEUKOCYTESUR SMALL* 08/04/2015 1045   Sepsis Labs Invalid input(s): PROCALCITONIN,  WBC,  LACTICIDVEN Microbiology Recent Results (from the past 240 hour(s))  Urine culture     Status: Abnormal   Collection Time: 08/04/15 10:45 AM  Result Value Ref Range  Status   Specimen Description URINE, CATHETERIZED  Final   Special Requests NONE  Final   Culture >=100,000 COLONIES/mL ESCHERICHIA COLI (A)  Final   Report Status 08/06/2015 FINAL  Final   Organism ID, Bacteria ESCHERICHIA COLI (A)  Final      Susceptibility   Escherichia coli - MIC*    AMPICILLIN <=2 SENSITIVE Sensitive     CEFAZOLIN <=4 SENSITIVE Sensitive     CEFTRIAXONE <=1 SENSITIVE Sensitive     CIPROFLOXACIN <=0.25 SENSITIVE Sensitive     GENTAMICIN <=1 SENSITIVE Sensitive  IMIPENEM <=0.25 SENSITIVE Sensitive     NITROFURANTOIN <=16 SENSITIVE Sensitive     TRIMETH/SULFA <=20 SENSITIVE Sensitive     AMPICILLIN/SULBACTAM <=2 SENSITIVE Sensitive     PIP/TAZO <=4 SENSITIVE Sensitive     * >=100,000 COLONIES/mL ESCHERICHIA COLI     Time coordinating discharge: Over 30 minutes  SIGNED:   Eddie North, MD  [facility] 08/06/2015, 4:34 PM Pager   If 7PM-7AM, please contact night-coverage [email or web address] Password [email or web address]

## 2015-08-06 NOTE — Clinical Social Work Note (Signed)
Consult received for SNF placement from MD. Call made to son Deno EtienneBrian Soley [phone]) regarding placement and son indicated that patient will be returning home at discharge and explained that he takes care of his grandmother and a provided of his caring for patient (approx. 10 years).  MD notified and [facility].  CSW signing off as patient will discharge home.  Genelle BalVanessa Briea Mcenery, MSW, LCSW Licensed Clinical Social Worker Clinical Social Work Department [facility] [phone]

## 2015-08-06 NOTE — Care Management Note (Signed)
Case Management Note  Patient Details  Name: Natalie Arnold MRN: [id number] Date of Birth: 10/28/1923  Subjective/Objective:           CM following for progression and d/c planning.         Action/Plan: 08/06/2015 Pt ready for d/c, PT recommending SNF, however family plans to take this pt home, has walker, wheelchair, oxygen etc at home and [facility]. [facility] wish to use [facility] and [facility] Plan is for son to pick up pt between 10:30pm and 11pm tonight after he gets home from work.   Expected Discharge Date:     08/06/2015             Expected Discharge Plan:  Home w Home Health Services  In-House Referral:  Clinical Social Work  Discharge planning Services  CM Consult  Post Acute Care Choice:  Home Health Choice offered to:  Adult Children  DME Arranged:  N/A DME Agency:  NA  HH Arranged:  RN, PT HH Agency:  [facility]  Status of Service:  Completed, signed off  Medicare Important Message Given:    Date Medicare IM Given:    Medicare IM give by:    Date Additional Medicare IM Given:    Additional Medicare Important Message give by:     If discussed at Long Length of Stay Meetings, dates discussed:    Additional Comments:  [name], [name], RN 08/06/2015, 4:30 PM

## 2015-08-06 NOTE — Progress Notes (Signed)
I noticed patient had some dry blood underneath nose and in nasal canula. I cleaned blood off and attached a humidifying bottle to her O2. Will continue to monitor.

## 2015-08-07 NOTE — Progress Notes (Signed)
Late entry    removed nsl left forearm, cath intact.Grandson here to pick up patient, instructions given and reviewed. Script given for keflex and all questions answered.

## 2015-08-26 NOTE — Progress Notes (Signed)
Late entry for missed G-code. Based on review of the evaluation and goals by Samul Dadauth Stout, PT.   08/05/15 1301  PT G-Codes **NOT FOR INPATIENT CLASS**  Functional Assessment Tool Used Clinical judgement based on review of the medical record  Functional Limitation Mobility: Walking and moving around  Mobility: Walking and Moving Around Current Status (Z6109(G8978) CL  Mobility: Walking and Moving Around Goal Status (U0454(G8979) CJ

## 2015-09-21 ENCOUNTER — Inpatient Hospital Stay (HOSPITAL_COMMUNITY): Payer: Medicare Other

## 2015-09-21 ENCOUNTER — Inpatient Hospital Stay (HOSPITAL_COMMUNITY)
Admission: EM | Admit: 2015-09-21 | Discharge: 2015-09-27 | DRG: 563 | Disposition: A | Discharge status: Hospice | Payer: Medicare Other | Attending: Internal Medicine | Admitting: Internal Medicine

## 2015-09-21 ENCOUNTER — Emergency Department (HOSPITAL_COMMUNITY): Payer: Medicare Other

## 2015-09-21 DIAGNOSIS — Z66 Do not resuscitate: Secondary | ICD-10-CM | POA: Diagnosis present

## 2015-09-21 DIAGNOSIS — L89109 Pressure ulcer of unspecified part of back, unspecified stage: Secondary | ICD-10-CM | POA: Diagnosis present

## 2015-09-21 DIAGNOSIS — Z86711 Personal history of pulmonary embolism: Secondary | ICD-10-CM

## 2015-09-21 DIAGNOSIS — R627 Adult failure to thrive: Secondary | ICD-10-CM | POA: Diagnosis present

## 2015-09-21 DIAGNOSIS — M353 Polymyalgia rheumatica: Secondary | ICD-10-CM | POA: Diagnosis present

## 2015-09-21 DIAGNOSIS — Z681 Body mass index (BMI) 19 or less, adult: Secondary | ICD-10-CM | POA: Diagnosis not present

## 2015-09-21 DIAGNOSIS — I350 Nonrheumatic aortic (valve) stenosis: Secondary | ICD-10-CM | POA: Diagnosis not present

## 2015-09-21 DIAGNOSIS — M810 Age-related osteoporosis without current pathological fracture: Secondary | ICD-10-CM | POA: Diagnosis present

## 2015-09-21 DIAGNOSIS — R748 Abnormal levels of other serum enzymes: Secondary | ICD-10-CM | POA: Diagnosis present

## 2015-09-21 DIAGNOSIS — R7989 Other specified abnormal findings of blood chemistry: Secondary | ICD-10-CM | POA: Diagnosis present

## 2015-09-21 DIAGNOSIS — S43015D Anterior dislocation of left humerus, subsequent encounter: Secondary | ICD-10-CM | POA: Diagnosis not present

## 2015-09-21 DIAGNOSIS — N3000 Acute cystitis without hematuria: Secondary | ICD-10-CM | POA: Diagnosis present

## 2015-09-21 DIAGNOSIS — T796XXA Traumatic ischemia of muscle, initial encounter: Secondary | ICD-10-CM | POA: Diagnosis present

## 2015-09-21 DIAGNOSIS — I248 Other forms of acute ischemic heart disease: Secondary | ICD-10-CM | POA: Diagnosis present

## 2015-09-21 DIAGNOSIS — Z9181 History of falling: Secondary | ICD-10-CM | POA: Diagnosis not present

## 2015-09-21 DIAGNOSIS — T148XXA Other injury of unspecified body region, initial encounter: Secondary | ICD-10-CM

## 2015-09-21 DIAGNOSIS — R64 Cachexia: Secondary | ICD-10-CM | POA: Diagnosis present

## 2015-09-21 DIAGNOSIS — I272 Other secondary pulmonary hypertension: Secondary | ICD-10-CM | POA: Diagnosis not present

## 2015-09-21 DIAGNOSIS — Z9981 Dependence on supplemental oxygen: Secondary | ICD-10-CM

## 2015-09-21 DIAGNOSIS — S43036A Inferior dislocation of unspecified humerus, initial encounter: Secondary | ICD-10-CM | POA: Diagnosis present

## 2015-09-21 DIAGNOSIS — Z515 Encounter for palliative care: Secondary | ICD-10-CM

## 2015-09-21 DIAGNOSIS — Z7982 Long term (current) use of aspirin: Secondary | ICD-10-CM

## 2015-09-21 DIAGNOSIS — R197 Diarrhea, unspecified: Secondary | ICD-10-CM | POA: Diagnosis present

## 2015-09-21 DIAGNOSIS — W19XXXA Unspecified fall, initial encounter: Secondary | ICD-10-CM | POA: Diagnosis not present

## 2015-09-21 DIAGNOSIS — M1712 Unilateral primary osteoarthritis, left knee: Secondary | ICD-10-CM | POA: Diagnosis present

## 2015-09-21 DIAGNOSIS — L89159 Pressure ulcer of sacral region, unspecified stage: Secondary | ICD-10-CM | POA: Diagnosis not present

## 2015-09-21 DIAGNOSIS — E871 Hypo-osmolality and hyponatremia: Secondary | ICD-10-CM

## 2015-09-21 DIAGNOSIS — Y92009 Unspecified place in unspecified non-institutional (private) residence as the place of occurrence of the external cause: Secondary | ICD-10-CM

## 2015-09-21 DIAGNOSIS — E876 Hypokalemia: Secondary | ICD-10-CM | POA: Diagnosis present

## 2015-09-21 DIAGNOSIS — I083 Combined rheumatic disorders of mitral, aortic and tricuspid valves: Secondary | ICD-10-CM | POA: Diagnosis present

## 2015-09-21 DIAGNOSIS — Z01810 Encounter for preprocedural cardiovascular examination: Secondary | ICD-10-CM | POA: Diagnosis not present

## 2015-09-21 DIAGNOSIS — Z7401 Bed confinement status: Secondary | ICD-10-CM

## 2015-09-21 DIAGNOSIS — S43015A Anterior dislocation of left humerus, initial encounter: Principal | ICD-10-CM | POA: Diagnosis present

## 2015-09-21 DIAGNOSIS — Z85828 Personal history of other malignant neoplasm of skin: Secondary | ICD-10-CM

## 2015-09-21 DIAGNOSIS — F0390 Unspecified dementia without behavioral disturbance: Secondary | ICD-10-CM | POA: Diagnosis present

## 2015-09-21 DIAGNOSIS — J449 Chronic obstructive pulmonary disease, unspecified: Secondary | ICD-10-CM | POA: Diagnosis present

## 2015-09-21 DIAGNOSIS — E860 Dehydration: Secondary | ICD-10-CM | POA: Diagnosis present

## 2015-09-21 DIAGNOSIS — I10 Essential (primary) hypertension: Secondary | ICD-10-CM | POA: Diagnosis present

## 2015-09-21 DIAGNOSIS — L8990 Pressure ulcer of unspecified site, unspecified stage: Secondary | ICD-10-CM

## 2015-09-21 DIAGNOSIS — E440 Moderate protein-calorie malnutrition: Secondary | ICD-10-CM | POA: Diagnosis not present

## 2015-09-21 DIAGNOSIS — R413 Other amnesia: Secondary | ICD-10-CM

## 2015-09-21 DIAGNOSIS — N390 Urinary tract infection, site not specified: Secondary | ICD-10-CM

## 2015-09-21 DIAGNOSIS — D638 Anemia in other chronic diseases classified elsewhere: Secondary | ICD-10-CM | POA: Diagnosis present

## 2015-09-21 LAB — CBC WITH DIFFERENTIAL/PLATELET
Basophils Absolute: 0 10*3/uL (ref 0.0–0.1)
Basophils Relative: 0 %
EOS PCT: 0 %
Eosinophils Absolute: 0 10*3/uL (ref 0.0–0.7)
HCT: 27.6 % — ABNORMAL LOW (ref 36.0–46.0)
HEMOGLOBIN: 9 g/dL — AB (ref 12.0–15.0)
LYMPHS ABS: 1.5 10*3/uL (ref 0.7–4.0)
LYMPHS PCT: 11 %
MCH: 31.9 pg (ref 26.0–34.0)
MCHC: 32.6 g/dL (ref 30.0–36.0)
MCV: 97.9 fL (ref 78.0–100.0)
Monocytes Absolute: 0.7 10*3/uL (ref 0.1–1.0)
Monocytes Relative: 5 %
Neutro Abs: 10.9 10*3/uL — ABNORMAL HIGH (ref 1.7–7.7)
Neutrophils Relative %: 84 %
Platelets: 235 10*3/uL (ref 150–400)
RBC: 2.82 MIL/uL — AB (ref 3.87–5.11)
RDW: 16.7 % — ABNORMAL HIGH (ref 11.5–15.5)
WBC: 13.1 10*3/uL — AB (ref 4.0–10.5)

## 2015-09-21 LAB — COMPREHENSIVE METABOLIC PANEL
ALK PHOS: 91 U/L (ref 38–126)
ALT: 22 U/L (ref 14–54)
AST: 53 U/L — ABNORMAL HIGH (ref 15–41)
Albumin: 3.4 g/dL — ABNORMAL LOW (ref 3.5–5.0)
Anion gap: 15 (ref 5–15)
BILIRUBIN TOTAL: 1.6 mg/dL — AB (ref 0.3–1.2)
BUN: 31 mg/dL — ABNORMAL HIGH (ref 6–20)
CALCIUM: 8.5 mg/dL — AB (ref 8.9–10.3)
CO2: 24 mmol/L (ref 22–32)
CREATININE: 0.81 mg/dL (ref 0.44–1.00)
Chloride: 95 mmol/L — ABNORMAL LOW (ref 101–111)
Glucose, Bld: 107 mg/dL — ABNORMAL HIGH (ref 65–99)
Potassium: 3.4 mmol/L — ABNORMAL LOW (ref 3.5–5.1)
Sodium: 134 mmol/L — ABNORMAL LOW (ref 135–145)
Total Protein: 6.9 g/dL (ref 6.5–8.1)

## 2015-09-21 LAB — URINE MICROSCOPIC-ADD ON: SQUAMOUS EPITHELIAL / LPF: NONE SEEN

## 2015-09-21 LAB — I-STAT CG4 LACTIC ACID, ED
LACTIC ACID, VENOUS: 2.71 mmol/L — AB (ref 0.5–1.9)
Lactic Acid, Venous: 2.91 mmol/L (ref 0.5–1.9)

## 2015-09-21 LAB — TROPONIN I: TROPONIN I: 0.06 ng/mL — AB (ref <0.03)

## 2015-09-21 LAB — URINALYSIS, ROUTINE W REFLEX MICROSCOPIC
GLUCOSE, UA: NEGATIVE mg/dL
Ketones, ur: 15 mg/dL — AB
NITRITE: POSITIVE — AB
PH: 6 (ref 5.0–8.0)
Protein, ur: 100 mg/dL — AB
SPECIFIC GRAVITY, URINE: 1.02 (ref 1.005–1.030)

## 2015-09-21 LAB — BRAIN NATRIURETIC PEPTIDE: B NATRIURETIC PEPTIDE 5: 731.1 pg/mL — AB (ref 0.0–100.0)

## 2015-09-21 LAB — PROTIME-INR
INR: 1.04
Prothrombin Time: 13.7 seconds (ref 11.4–15.2)

## 2015-09-21 LAB — CK: Total CK: 928 U/L — ABNORMAL HIGH (ref 38–234)

## 2015-09-21 MED ORDER — ETOMIDATE 2 MG/ML IV SOLN
10.0000 mg | Freq: Once | INTRAVENOUS | Status: AC
  Administered 2015-09-21: 5 mg via INTRAVENOUS
  Filled 2015-09-21: qty 10

## 2015-09-21 MED ORDER — SODIUM CHLORIDE 0.9 % IV SOLN
INTRAVENOUS | Status: AC
  Administered 2015-09-22: 03:00:00 via INTRAVENOUS

## 2015-09-21 MED ORDER — SODIUM CHLORIDE 0.9 % IV BOLUS (SEPSIS)
500.0000 mL | Freq: Once | INTRAVENOUS | Status: AC
  Administered 2015-09-21: 500 mL via INTRAVENOUS

## 2015-09-21 MED ORDER — SODIUM CHLORIDE 0.9 % IV BOLUS (SEPSIS)
1000.0000 mL | Freq: Once | INTRAVENOUS | Status: AC
  Administered 2015-09-21: 1000 mL via INTRAVENOUS

## 2015-09-21 MED ORDER — MORPHINE SULFATE (PF) 4 MG/ML IV SOLN
4.0000 mg | Freq: Once | INTRAVENOUS | Status: AC
  Administered 2015-09-21: 4 mg via INTRAVENOUS
  Filled 2015-09-21: qty 1

## 2015-09-21 MED ORDER — DEXTROSE 5 % IV SOLN
1.0000 g | Freq: Once | INTRAVENOUS | Status: AC
  Administered 2015-09-21: 1 g via INTRAVENOUS
  Filled 2015-09-21: qty 10

## 2015-09-21 NOTE — ED Notes (Signed)
Lactic given to MD. Rancour.

## 2015-09-21 NOTE — ED Triage Notes (Signed)
Pt BIB [facility] after having SOB today. O2 normal for [facility]. Uses 2L [place] as needed. Pt also had a fall out of a chair last week and has swelling and discoloration to her L arm and a sore on her back as well. Pt is normally able to be up and walking around per family. Alert. Dementia at baseline.

## 2015-09-21 NOTE — Progress Notes (Signed)
If patient is discharged this evening from ED please order:  Hospital bed  Home health services RN, PT, OT, aide and SW with  [facility]
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
## 2015-09-21 NOTE — ED Notes (Signed)
Delay in drawing labs due to pt being in CT
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
## 2015-09-21 NOTE — ED Notes (Signed)
Critical troponin 0.06. Primary nurse made aware of same.

## 2015-09-21 NOTE — ED Notes (Signed)
Lactic 2.71 provider notified

## 2015-09-21 NOTE — ED Notes (Signed)
IV ATTEMPT X2 UNSUCCESSFUL.

## 2015-09-21 NOTE — ED Notes (Signed)
Patient transported to X-ray 

## 2015-09-21 NOTE — Progress Notes (Addendum)
EDCM went to speak to patient at bedside, however, no family at bedside. Last admission 06/14 to 06/16 for UTI. EDCM called patient's grandson and cargiver [name] [phone]. Patient lives with [name]. [name] reports patient receives 24/7 care at home between himself and his girlfriend. Patient has a bedside commode, walker, shower seat, ramp, wheelchair and wears oxygen as needed supplied by [facility]. [name] reports today is the first day he has had to feed the patient. He has been bathing her in bed. He reports she has been bed ridden for a few weeks now. Pcp Hal Stoneking Patient's other grandson Amada Jupiter is patient's POA and is aware patient is in ED per [name]. [name] reports it is patient's wishes to not be placed into a SNF and will be returning home on discharge.  [name] has been taking care of patient for 10 years.   Patient has had home health services with [facility] in the past.  [name] reports he would like to have [facility] services with [facility] again.  Also expressed interest in hospital bed with rails as patient fell out of bed today. Awaiting disposition.

## 2015-09-22 ENCOUNTER — Inpatient Hospital Stay (HOSPITAL_COMMUNITY): Payer: Medicare Other | Admitting: Certified Registered Nurse Anesthetist

## 2015-09-22 ENCOUNTER — Encounter (HOSPITAL_COMMUNITY): Payer: Self-pay | Admitting: General Practice

## 2015-09-22 ENCOUNTER — Encounter (HOSPITAL_COMMUNITY)
Admission: EM | Disposition: A | Discharge status: Hospice | Payer: Self-pay | Source: Home / Self Care | Attending: Internal Medicine

## 2015-09-22 DIAGNOSIS — I248 Other forms of acute ischemic heart disease: Secondary | ICD-10-CM

## 2015-09-22 DIAGNOSIS — R7989 Other specified abnormal findings of blood chemistry: Secondary | ICD-10-CM | POA: Diagnosis present

## 2015-09-22 DIAGNOSIS — R197 Diarrhea, unspecified: Secondary | ICD-10-CM | POA: Diagnosis present

## 2015-09-22 DIAGNOSIS — R748 Abnormal levels of other serum enzymes: Secondary | ICD-10-CM

## 2015-09-22 DIAGNOSIS — N3000 Acute cystitis without hematuria: Secondary | ICD-10-CM

## 2015-09-22 DIAGNOSIS — E871 Hypo-osmolality and hyponatremia: Secondary | ICD-10-CM | POA: Diagnosis present

## 2015-09-22 HISTORY — PX: SHOULDER CLOSED REDUCTION: SHX1051

## 2015-09-22 LAB — CBC
HCT: 26.7 % — ABNORMAL LOW (ref 36.0–46.0)
Hemoglobin: 8.4 g/dL — ABNORMAL LOW (ref 12.0–15.0)
MCH: 31.5 pg (ref 26.0–34.0)
MCHC: 31.5 g/dL (ref 30.0–36.0)
MCV: 100 fL (ref 78.0–100.0)
PLATELETS: 163 10*3/uL (ref 150–400)
RBC: 2.67 MIL/uL — ABNORMAL LOW (ref 3.87–5.11)
RDW: 16.9 % — AB (ref 11.5–15.5)
WBC: 12.7 10*3/uL — AB (ref 4.0–10.5)

## 2015-09-22 LAB — BASIC METABOLIC PANEL
ANION GAP: 14 (ref 5–15)
BUN: 27 mg/dL — AB (ref 6–20)
CALCIUM: 7.8 mg/dL — AB (ref 8.9–10.3)
CHLORIDE: 99 mmol/L — AB (ref 101–111)
CO2: 21 mmol/L — ABNORMAL LOW (ref 22–32)
CREATININE: 0.81 mg/dL (ref 0.44–1.00)
GLUCOSE: 92 mg/dL (ref 65–99)
Potassium: 3.3 mmol/L — ABNORMAL LOW (ref 3.5–5.1)
Sodium: 134 mmol/L — ABNORMAL LOW (ref 135–145)

## 2015-09-22 LAB — ABO/RH: ABO/RH(D): A NEG

## 2015-09-22 LAB — MRSA PCR SCREENING: MRSA BY PCR: NEGATIVE

## 2015-09-22 SURGERY — CLOSED REDUCTION, SHOULDER
Anesthesia: General | Laterality: Left

## 2015-09-22 MED ORDER — POTASSIUM CHLORIDE 10 MEQ/100ML IV SOLN
10.0000 meq | INTRAVENOUS | Status: AC
  Administered 2015-09-22 (×2): 10 meq via INTRAVENOUS
  Filled 2015-09-22 (×2): qty 100

## 2015-09-22 MED ORDER — ONDANSETRON HCL 4 MG PO TABS: 4.0000 mg | ORAL_TABLET | Freq: Four times a day (QID) | ORAL | Status: DC | PRN

## 2015-09-22 MED ORDER — ROCURONIUM BROMIDE 50 MG/5ML IV SOLN
INTRAVENOUS | Status: AC
  Filled 2015-09-22: qty 1

## 2015-09-22 MED ORDER — LORAZEPAM 0.5 MG PO TABS: 0.5000 mg | ORAL_TABLET | Freq: Two times a day (BID) | ORAL | Status: DC | PRN

## 2015-09-22 MED ORDER — ACETAMINOPHEN 650 MG RE SUPP: 650.0000 mg | Freq: Four times a day (QID) | RECTAL | Status: DC | PRN

## 2015-09-22 MED ORDER — ALBUTEROL SULFATE HFA 108 (90 BASE) MCG/ACT IN AERS
INHALATION_SPRAY | RESPIRATORY_TRACT | Status: AC
  Filled 2015-09-22: qty 6.7

## 2015-09-22 MED ORDER — FENTANYL CITRATE (PF) 100 MCG/2ML IJ SOLN: 25.0000 ug | INTRAMUSCULAR | Status: DC | PRN

## 2015-09-22 MED ORDER — FENTANYL CITRATE (PF) 250 MCG/5ML IJ SOLN
INTRAMUSCULAR | Status: AC
Start: 2015-09-22 — End: 2015-09-22
  Filled 2015-09-22: qty 5

## 2015-09-22 MED ORDER — ASPIRIN 81 MG PO CHEW
81.0000 mg | CHEWABLE_TABLET | Freq: Every day | ORAL | Status: DC
  Administered 2015-09-23 – 2015-09-27 (×4): 81 mg via ORAL
  Filled 2015-09-22 (×4): qty 1

## 2015-09-22 MED ORDER — PROPOFOL 10 MG/ML IV BOLUS
INTRAVENOUS | Status: AC
  Filled 2015-09-22: qty 20

## 2015-09-22 MED ORDER — ETOMIDATE 2 MG/ML IV SOLN
INTRAVENOUS | Status: DC | PRN
  Administered 2015-09-22: 6 mg via INTRAVENOUS

## 2015-09-22 MED ORDER — SODIUM CHLORIDE 0.9 % IV SOLN
INTRAVENOUS | Status: DC
  Administered 2015-09-22 – 2015-09-23 (×2): via INTRAVENOUS

## 2015-09-22 MED ORDER — SENNOSIDES-DOCUSATE SODIUM 8.6-50 MG PO TABS: 1.0000 | ORAL_TABLET | Freq: Every day | ORAL | Status: DC | PRN

## 2015-09-22 MED ORDER — ACETAMINOPHEN 325 MG PO TABS: 650.0000 mg | ORAL_TABLET | Freq: Four times a day (QID) | ORAL | Status: DC | PRN

## 2015-09-22 MED ORDER — ENSURE ENLIVE PO LIQD
237.0000 mL | Freq: Two times a day (BID) | ORAL | Status: DC
  Administered 2015-09-23: 237 mL via ORAL
  Filled 2015-09-22 (×4): qty 237

## 2015-09-22 MED ORDER — ETOMIDATE 2 MG/ML IV SOLN
INTRAVENOUS | Status: AC
  Filled 2015-09-22: qty 10

## 2015-09-22 MED ORDER — LIDOCAINE 2% (20 MG/ML) 5 ML SYRINGE
INTRAMUSCULAR | Status: AC
  Filled 2015-09-22: qty 5

## 2015-09-22 MED ORDER — MORPHINE SULFATE (PF) 2 MG/ML IV SOLN
2.0000 mg | INTRAVENOUS | Status: DC | PRN
  Administered 2015-09-23 – 2015-09-24 (×7): 2 mg via INTRAVENOUS
  Filled 2015-09-22 (×8): qty 1

## 2015-09-22 MED ORDER — LISINOPRIL 5 MG PO TABS
5.0000 mg | ORAL_TABLET | Freq: Every day | ORAL | Status: DC
  Administered 2015-09-23 – 2015-09-27 (×4): 5 mg via ORAL
  Filled 2015-09-22 (×4): qty 1

## 2015-09-22 MED ORDER — PHENYLEPHRINE HCL 10 MG/ML IJ SOLN
INTRAMUSCULAR | Status: DC | PRN
  Administered 2015-09-22: 20 ug/min via INTRAVENOUS

## 2015-09-22 MED ORDER — ONDANSETRON HCL 4 MG/2ML IJ SOLN: 4.0000 mg | Freq: Four times a day (QID) | INTRAMUSCULAR | Status: DC | PRN

## 2015-09-22 MED ORDER — ONDANSETRON HCL 4 MG/2ML IJ SOLN
INTRAMUSCULAR | Status: AC
  Filled 2015-09-22: qty 2

## 2015-09-22 MED ORDER — SUCCINYLCHOLINE CHLORIDE 20 MG/ML IJ SOLN
INTRAMUSCULAR | Status: DC | PRN
  Administered 2015-09-22: 60 mg via INTRAVENOUS

## 2015-09-22 MED ORDER — DEXTROSE 5 % IV SOLN
1.0000 g | INTRAVENOUS | Status: DC
  Administered 2015-09-22 – 2015-09-25 (×4): 1 g via INTRAVENOUS
  Filled 2015-09-22 (×7): qty 10

## 2015-09-22 MED ORDER — MAGNESIUM HYDROXIDE 400 MG/5ML PO SUSP
30.0000 mL | Freq: Every day | ORAL | Status: DC | PRN
Start: 2015-09-22 — End: 2015-09-27

## 2015-09-22 MED ORDER — COLLAGENASE 250 UNIT/GM EX OINT
TOPICAL_OINTMENT | Freq: Every day | CUTANEOUS | Status: DC
  Administered 2015-09-23: 16:00:00 via TOPICAL
  Administered 2015-09-24: 1 via TOPICAL
  Administered 2015-09-25 – 2015-09-27 (×3): via TOPICAL
  Filled 2015-09-22 (×3): qty 30
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
## 2015-09-22 NOTE — Progress Notes (Signed)
Initial Nutrition Assessment  DOCUMENTATION CODES:   Underweight  INTERVENTION:  Diet advancement per MD Ensure Enlive po BID, each supplement provides 350 kcal and 20 grams of protein Provide 30 ml pro-stat daily, each dose provides 15 grams protein and 100 kcal Provide multivitamin with minerals daily   NUTRITION DIAGNOSIS:   Increased nutrient needs related to wound healing as evidenced by estimated needs.   GOAL:   Patient will meet greater than or equal to 90% of their needs   MONITOR:   PO intake, Supplement acceptance, Labs, Weight trends, Skin  REASON FOR ASSESSMENT:   Malnutrition Screening Tool    ASSESSMENT:   82 y.o. female with medical history significant of HTN, aortic stenosis, PE, PMR, h/o frequent falls presenting to the ER after grandson called 911 for concern of SOB. The grandson was present in the ER for a very short period of time and reported that patient has "fallen out of bed in the past 2-3 days" but otherwise has not fallen since June.  The patient was found to be quite disheveled with diffuse bruising and extensive wounds to her back.  Her left shoulder was found to be dislocated.  Pt out of room for surgery at time of visit; NPO since admission. Per MD note, pt was having persistent diarrhea while in the ED. Pt familiar to nutrition team from previous admission in mid June at which time pt reported having a good appetite and drinking Ensure supplements at home. Pt's weight is stable from one year ago with a 5 lb weight gain in the past 6 weeks.  Per WOC note, pt has multiple pressure ulcers on sacrum and spine. Will follow-up for full nutrition assessment with physical exam at later date.   Labs: low potassium, low chloride, low calcium, low hemoglobin  Diet Order:  Diet NPO time specified  Skin:  Wound (see comment) (stage III pressure ulcer on sacrum; laceration on left arm)  Last BM:  unknown  Height:   Ht Readings from Last 1 Encounters:   09/21/15 5\' 7"  (1.702 m)    Weight:   Wt Readings from Last 1 Encounters:  09/21/15 117 lb (53.1 kg)    Ideal Body Weight:  61.36 kg  BMI:  Body mass index is 18.32 kg/m.  Estimated Nutritional Needs:   Kcal:  1500-1700  Protein:  75-85 grams  Fluid:  1.5 L/day  EDUCATION NEEDS:   No education needs identified at this time  Dorothea Ogle RD, LDN Inpatient Clinical Dietitian Pager: [phone] After Hours Pager: [phone]

## 2015-09-22 NOTE — Op Note (Signed)
   Date of Surgery: 09/22/2015  INDICATIONS: Natalie Arnold is a 82 y.o.-year-old female with a left shoulder dislocation of unknown chronicity;  The family did consent to the procedure after discussion of the risks and benefits.  PREOPERATIVE DIAGNOSIS: Left anterior inferior shoulder dislocation  POSTOPERATIVE DIAGNOSIS: Same.  PROCEDURE: Manipulation of left shoulder joint under general anesthesia  SURGEON: N. Glee Arvin, M.D.  ASSIST: none.  ANESTHESIA:  general  IV FLUIDS AND URINE: See anesthesia.  ESTIMATED BLOOD LOSS: none mL.  IMPLANTS: none  DRAINS: none  COMPLICATIONS: None.  DESCRIPTION OF PROCEDURE: The patient was brought to the operating room and placed supine on the operating table.  The patient had been signed prior to the procedure and this was documented. The patient had the anesthesia placed by the anesthesiologist.  A time-out was performed to confirm that this was the correct patient, site, side and location. I attempted to reduce the shoulder in a gentle fashion but I could not feel that the humeral head was mobile enough.  I felt that the shoulder had been dislocated for at least a few days.  I felt that she was at risk for an iatrogenic fracture if I had been more aggressive.  Given the fact that her left upper extremity is neurovascularly intact, we decided to leave her shoulder dislocated.  Patient had no immediate complications.  POSTOPERATIVE PLAN: Will admit to stepdown unit.  Keep arterial line for possible surgery.  I will discuss case with my shoulder colleagues.  Natalie Reel, MD [facility] [phone] 2:51 PM

## 2015-09-22 NOTE — Consult Note (Signed)
WOC Nurse wound consult note Reason for Consult: Multiple pressure ulcers along spine and bilateral sacral sites.  Wound type: Pressure injuries  Pressure Ulcer POA: Yes  Measuremens:  Spine:  14 cm x 3.2 cm.  Periwound site erythematous with some scattered, superficial loss of tissue.  Much of the impaired area is covered with eschar and yellow slough, and irregularly shaped.  See ED photos for clarification.  Left sacral area:  8 cm x 3.8 cm also covered with eschar with periwound erythema.  Right sacral area:  3.5 cm x 7 cm covered with eschar with periwound erythema.  Spoke with Dr. Isidoro Donning pertaining to wound assessment findings, goals of care, and possible avenues of treatment.    IF aggressive treatment is decided upon, then consider possible surgical debridement of all sites.    Current recommendations:  Apply Santyl to described areas, cover with saline moistened gauze and foam pad(s).  This will be a twice daily dressing change.   Discussed POC with patient and bedside nurse.  Re consult if needed, will not follow at this time. Thanks Helmut Muster MSN, RN, CNS-BC, [employer]

## 2015-09-22 NOTE — Transfer of Care (Signed)
Immediate Anesthesia Transfer of Care Note  Patient: Natalie Arnold  Procedure(s) Performed: Procedure(s): CLOSED REDUCTION SHOULDER (Left)  Patient Location: PACU  Anesthesia Type:General  Level of Consciousness: awake, alert  and oriented  Airway & Oxygen Therapy: Patient Spontanous Breathing and Patient connected to nasal cannula oxygen  Post-op Assessment: Report given to RN, Post -op Vital signs reviewed and stable and Patient moving all extremities X 4  Post vital signs: Reviewed and stable  Last Vitals:  Vitals:   09/22/15 0556 09/22/15 0900  BP: (!) 86/29 (!) 71/31  Pulse: 81 86  Resp: 19 17  Temp:  36.6 C    Last Pain:  Vitals:   09/22/15 0900  TempSrc: Axillary         Complications: No apparent anesthesia complications

## 2015-09-22 NOTE — Progress Notes (Signed)
Pharmacy Antibiotic Note  Natalie Arnold is a 82 y.o. female admitted on 09/21/2015 with UTI.  Pharmacy has been consulted for rocephin dosing.  Plan: Rocephin 1Gm IV q24h  Height: 5\' 7"  (170.2 cm) Weight: 117 lb (53.1 kg) IBW/kg (Calculated) : 61.6  Temp (24hrs), Avg:97.4 F (36.3 C), Min:97.4 F (36.3 C), Max:97.4 F (36.3 C)   Recent Labs Lab 09/21/15 2031 09/21/15 2042 09/21/15 2329  WBC 13.1*  --   --   CREATININE 0.81  --   --   LATICACIDVEN  --  2.71* 2.91*    Estimated Creatinine Clearance: 38.7 mL/min (by C-G formula based on SCr of 0.81 mg/dL).    Allergies  Allergen Reactions  . Ultracet [Tramadol-Acetaminophen] Other (See Comments)    confusion  . Penicillins Rash    Antimicrobials this admission: 8/1 rocephin >>    >>   Dose adjustments this admission:   Microbiology results:  BCx:   UCx:    Sputum:    MRSA PCR:   Thank you for allowing pharmacy to be a part of this patient's care.  Lorenza Evangelist 09/22/2015 12:24 AM
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
## 2015-09-22 NOTE — Anesthesia Postprocedure Evaluation (Addendum)
Anesthesia Post Note  Patient: Natalie Arnold  Procedure(s) Performed: Procedure(s) (LRB): CLOSED REDUCTION SHOULDER (Left)  Patient location during evaluation: PACU Anesthesia Type: General Level of consciousness: awake and alert and confused Pain management: pain level controlled Vital Signs Assessment: post-procedure vital signs reviewed and stable Respiratory status: spontaneous breathing, nonlabored ventilation, respiratory function stable and patient connected to nasal cannula oxygen Cardiovascular status: blood pressure returned to baseline and stable Postop Assessment: no signs of nausea or vomiting Anesthetic complications: no Comments: Patient to be transferred to stepdown or ICU bed to maintain arterial line, given patient may return to OR for definitive management of her shoulder.  If patient does return to the OR, cardiology consultation will be helpful to optimize her heart function perioperatively.    Last Vitals:  Vitals:   09/22/15 1630 09/22/15 1645  BP:    Pulse: 75 75  Resp: 14 14  Temp:      Last Pain:  Vitals:   09/22/15 0900  TempSrc: Axillary                 Tehila Sokolow,E. Alianys Chacko

## 2015-09-22 NOTE — Consult Note (Signed)
WOC Nurse wound consult note  AMENDMENT TO NOTE dated 09/22/2015 at 12:06 pm.  The recommended dressing frequency is daily.  This will be reflected in the order.

## 2015-09-22 NOTE — Anesthesia Preprocedure Evaluation (Addendum)
Anesthesia Evaluation  Patient identified by MRN, date of birth, ID band Patient awake and Patient confused    History of Anesthesia Complications Negative for: history of anesthetic complications  Airway Mallampati: I  TM Distance: >3 FB Neck ROM: Full    Dental  (+) Poor Dentition, Missing, Chipped, Loose, Dental Advisory Given   Pulmonary PE   breath sounds clear to auscultation       Cardiovascular hypertension, Pt. on medications + Peripheral Vascular Disease and +CHF   Rhythm:Regular Rate:Normal + Systolic murmurs and + Diastolic murmurs '16 ECHO:  EF 55-60%,  Mild LVH, severe aortic stenosis with Mean gradient (S): 61 mm Hg. Peak gradient (S): 101 mm Hg, mild mitral stenosis with mod MR   Neuro/Psych Anxiety dementia   GI/Hepatic negative GI ROS, Neg liver ROS,   Endo/Other  Polymyalgia rheumatica  Renal/GU Possible rhabdo     Musculoskeletal  (+) Arthritis , Osteoarthritis,    Abdominal   Peds  Hematology  (+) anemia , Hb 8.4   Anesthesia Other Findings   Reproductive/Obstetrics                            Anesthesia Physical Anesthesia Plan  ASA: IV  Anesthesia Plan: General   Post-op Pain Management:    Induction: Intravenous  Airway Management Planned: Mask  Additional Equipment: Arterial line  Intra-op Plan:   Post-operative Plan:   Informed Consent: I have reviewed the patients History and Physical, chart, labs and discussed the procedure including the risks, benefits and alternatives for the proposed anesthesia with the patient or authorized representative who has indicated his/her understanding and acceptance.   Dental advisory given and Consent reviewed with POA  Plan Discussed with: CRNA and Surgeon  Anesthesia Plan Comments: (Plan routine monitors, GA Discussed with patient's guardian/grandson by telephone)        Anesthesia Quick Evaluation

## 2015-09-23 ENCOUNTER — Encounter (HOSPITAL_COMMUNITY): Payer: Self-pay | Admitting: Orthopaedic Surgery

## 2015-09-23 ENCOUNTER — Inpatient Hospital Stay (HOSPITAL_COMMUNITY): Payer: Medicare Other

## 2015-09-23 DIAGNOSIS — Z01810 Encounter for preprocedural cardiovascular examination: Secondary | ICD-10-CM

## 2015-09-23 DIAGNOSIS — I350 Nonrheumatic aortic (valve) stenosis: Secondary | ICD-10-CM

## 2015-09-23 DIAGNOSIS — Z515 Encounter for palliative care: Secondary | ICD-10-CM

## 2015-09-23 DIAGNOSIS — R197 Diarrhea, unspecified: Secondary | ICD-10-CM

## 2015-09-23 DIAGNOSIS — S43015D Anterior dislocation of left humerus, subsequent encounter: Secondary | ICD-10-CM

## 2015-09-23 DIAGNOSIS — S43015A Anterior dislocation of left humerus, initial encounter: Principal | ICD-10-CM

## 2015-09-23 DIAGNOSIS — R7989 Other specified abnormal findings of blood chemistry: Secondary | ICD-10-CM

## 2015-09-23 DIAGNOSIS — F0390 Unspecified dementia without behavioral disturbance: Secondary | ICD-10-CM

## 2015-09-23 DIAGNOSIS — E876 Hypokalemia: Secondary | ICD-10-CM

## 2015-09-23 LAB — CBC
HEMATOCRIT: 21.1 % — AB (ref 36.0–46.0)
HEMATOCRIT: 30.4 % — AB (ref 36.0–46.0)
HEMOGLOBIN: 6.7 g/dL — AB (ref 12.0–15.0)
HEMOGLOBIN: 9.7 g/dL — AB (ref 12.0–15.0)
MCH: 31.6 pg (ref 26.0–34.0)
MCH: 31 pg (ref 26.0–34.0)
MCHC: 31.8 g/dL (ref 30.0–36.0)
MCHC: 31.9 g/dL (ref 30.0–36.0)
MCV: 99.5 fL (ref 78.0–100.0)
MCV: 97.1 fL (ref 78.0–100.0)
Platelets: 154 10*3/uL (ref 150–400)
Platelets: 149 10*3/uL — ABNORMAL LOW (ref 150–400)
RBC: 2.12 MIL/uL — ABNORMAL LOW (ref 3.87–5.11)
RBC: 3.13 MIL/uL — ABNORMAL LOW (ref 3.87–5.11)
RDW: 17.2 % — AB (ref 11.5–15.5)
RDW: 16.8 % — ABNORMAL HIGH (ref 11.5–15.5)
WBC: 8.5 10*3/uL (ref 4.0–10.5)
WBC: 7.1 10*3/uL (ref 4.0–10.5)

## 2015-09-23 LAB — BASIC METABOLIC PANEL
ANION GAP: 9 (ref 5–15)
BUN: 17 mg/dL (ref 6–20)
CO2: 23 mmol/L (ref 22–32)
Calcium: 7.7 mg/dL — ABNORMAL LOW (ref 8.9–10.3)
Chloride: 103 mmol/L (ref 101–111)
Creatinine, Ser: 0.64 mg/dL (ref 0.44–1.00)
GFR calc Af Amer: >60 mL/min (ref >60)
GFR calc non Af Amer: >60 mL/min (ref >60)
GLUCOSE: 85 mg/dL (ref 65–99)
POTASSIUM: 3.5 mmol/L (ref 3.5–5.1)
Sodium: 135 mmol/L (ref 135–145)

## 2015-09-23 LAB — FOLATE: FOLATE: 30.3 ng/mL (ref >5.9)

## 2015-09-23 LAB — RETICULOCYTES
RBC.: 3.23 MIL/uL — AB (ref 3.87–5.11)
Retic Count, Absolute: 113.1 10*3/uL (ref 19.0–186.0)
Retic Ct Pct: 3.5 % — ABNORMAL HIGH (ref 0.4–3.1)

## 2015-09-23 LAB — VITAMIN B12: Vitamin B-12: 1640 pg/mL — ABNORMAL HIGH (ref 180–914)

## 2015-09-23 LAB — PREPARE RBC (CROSSMATCH)

## 2015-09-23 LAB — IRON AND TIBC
Iron: 150 ug/dL (ref 28–170)
SATURATION RATIOS: 88 % — AB (ref 10.4–31.8)
TIBC: 171 ug/dL — AB (ref 250–450)
UIBC: 21 ug/dL

## 2015-09-23 LAB — URINE CULTURE: CULTURE: NO GROWTH

## 2015-09-23 LAB — FERRITIN: Ferritin: 193 ng/mL (ref 11–307)

## 2015-09-23 MED ORDER — SODIUM CHLORIDE 0.9 % IV SOLN: Freq: Once | INTRAVENOUS | Status: DC

## 2015-09-23 MED ORDER — SODIUM CHLORIDE 0.9 % IV SOLN
Freq: Once | INTRAVENOUS | Status: AC
  Administered 2015-09-23: 12:00:00 via INTRAVENOUS
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
## 2015-09-23 NOTE — Progress Notes (Signed)
CRITICAL VALUE ALERT  Critical value received:  hgb 6.7  Date of notification:  09/23/2015  Time of notification:  0410  Critical value read back: yes  Nurse who received alert: Arrie Aran RN  MD notified (1st page):  NP Donnamarie Poag  Time of first page:  [phone]  MD notified (2nd page):  Time of second page:  Responding MD:  NP Donnamarie Poag  Time MD responded: [phone]

## 2015-09-23 NOTE — Progress Notes (Signed)
I spoke with grandson over the phone and he states that patient has had severe decline in physical and mental condition in the last two months.  We discussed the risks, benefits, alternatives to surgical reduction, we agreed to defer that at this point.  We'll focus on making her left shoulder comfortable.  Her LUE is NVI and well perfused therefore we can leave it.  If they change their mind, we can press forward although she would be high risk for surgery and general anesthesia.  Mayra Reel, MD [facility] [phone] 3:17 PM

## 2015-09-23 NOTE — Progress Notes (Signed)
Ordered I unit PRBC to be transfused but grandson refused.

## 2015-09-23 NOTE — Progress Notes (Signed)
Pt hasn't voided since I picked her up at 1400.  No s/s of fluid overload at this time.  IVF at 17ml/hr.  Notified dr. Isidoro Donning via text.  Reported to oncoming nurse who will do bladder scan and continue to follow up

## 2015-09-23 NOTE — Progress Notes (Signed)
Called Natalie Arnold for consent for blood with no answer and no option to leave a voicemail. Then called second contact patients grandson Paislie Perotti. Trinae Alvares states he does not want grandmother to have blood at this time and wants to wait until he can come to the hospital to see her and talk with physician.  Horton Chin, RN
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
## 2015-09-23 NOTE — Progress Notes (Signed)
   Subjective:  Patient is stable.    Objective:   VITALS:   Vitals:   09/23/15 0401 09/23/15 0500 09/23/15 0600 09/23/15 0700  BP:   (!) 101/34 (!) 102/20  Pulse:  78 69 69  Resp:  (!) 22 13 13   Temp: 97.5 F (36.4 C)     TempSrc: Oral     SpO2:  96% 98% 97%  Weight:      Height:        MSK exam stable   Lab Results  Component Value Date   WBC 8.5 09/23/2015   HGB 6.7 (LL) 09/23/2015   HCT 21.1 (L) 09/23/2015   MCV 99.5 09/23/2015   PLT 154 09/23/2015     Assessment/Plan:  1 Day Post-Op   - patient's grandson refused transfusion for Hgb 6.7 - I have left a voicemail to call me back regarding their wishes on how aggressive to be with the shoulder - patient would need open reduction which would result in blood loss from surgery but patient is very high risk given valvular heart disease  Cheral Almas 09/23/2015, 7:45 AM [phone]

## 2015-09-23 NOTE — Consult Note (Signed)
Consultation Note Date: 09/23/2015   Patient Name: Natalie Arnold  DOB: 1923-10-08  MRN: [id number]  Age / Sex: 82 y.o., female  PCP: Lajean Manes, MD Referring Physician: Mendel Corning, MD  Reason for Consultation: Establishing goals of care  HPI/Patient Profile: 82 y.o. female  with past medical history of HTN, aortic stenosis, PE, h/o frequent falls, polymyalgia rheumatica, osteoporosis admitted on 09/21/2015 with SOB. Found to have left anterior shoulder dislocation that we have not been able to repair without surgical interventions. However, d/t comorbitities and severe aortic stenosis she is not a candidate for surgery.   Clinical Assessment and Goals of Care: We met today with Natalie Arnold. She did not know she is in the hospital. She is pleasant but confused. She does not remember falling out of bed or how her shoulder became injured. She denies having any fears of anything or anyone. She says she feels safe at home.   I did speak with her grandson, Natalie Arnold, who is also her caregiver at home. Natalie Arnold expresses how difficult it has become to care for Natalie Arnold since her hospitalization in June as she is much less functional. Prior to June she was ambulating with walker (although with frequent falls) but developed UTI and made falls worse requiring hospital admission. Now she has required assistance with all ADLs and most recently even with feeding herself.   Natalie Arnold says that he and his girlfriend have been providing 24/7 care for her at home. He says that he promised he would keep her at home as she always wished to die at home like her husband. However, Natalie Arnold admits that he needs more help with her current state. He says that he got her in the shower chair and turned around to get a towel when she started to slip out of the chair (this is where her wounds on spine are from) and when he went to catch her he said her skin  broke on her arm. He says that her arms have been bruised since her last hospitalization. He also says that appetite has been poor but she has been drinking a lot of water and he has had trouble keeping her dry (he says she was constantly urinating).   We discussed her decline along with the natural progression of dementia. Natalie Arnold is very understanding. We discuss how she would likely be a candidate for hospice care. Natalie Arnold would very much like for hospice to help them at home as she has always wished to die at home where her husband died. Natalie Arnold able to express that he promised to keep her home so she would not have to go to a nursing home and be able to die at home. He says she has had a long and good life and many others (even her only son) have already passed on.   Of note, he says that his father died from complications from Hep C that they believe he received from blood transfusion which is why they were hesitant to approve her blood  transfusion.   NEXT OF KIN grandson Natalie Arnold    SUMMARY OF RECOMMENDATIONS   - DNR - Hopeful for hospice at home - Goal is to allow her to die in her home  Code Status/Advance Care Planning:  DNR   Symptom Management:   Pain: Morphine 2 mg IV every 2 hours prn.   Appetite was quite good today but required assistance and feeding.   Palliative Prophylaxis:   Aspiration, Delirium Protocol, Frequent Pain Assessment and Turn Reposition  Additional Recommendations (Limitations, Scope, Preferences):  Avoid Hospitalization  Psycho-social/Spiritual:   Desire for further Chaplaincy support:no  Additional Recommendations: Caregiving  Support/Resources and Education on Hospice  Prognosis:   < 6 months with advancing dementia.   Discharge Planning: Home with Hospice      Primary Diagnoses: Present on Admission: . Anterior dislocation of left shoulder . Pressure ulcer . (Resolved) Acute cystitis without hematuria . Elevated CK . Memory loss . UTI  (lower urinary tract infection) . Hypokalemia . Hyponatremia . Malnutrition of moderate degree . Demand ischemia ([place]) . High serum lactate . Diarrhea   I have reviewed the medical record, interviewed the patient and family, and examined the patient. The following aspects are pertinent.  Past Medical History:  Diagnosis Date  . Anxiety   . Aortic stenosis, mild last echo 08/2009   pt declines further echoes 08/2010  . Carotid artery occlusion    carotid bruits no ICA stenosis  . History of shingles 2012  . Hypertension   . Hyponatremia 05/2009   Mild  . Osteoarthritis of left knee   . Osteoporosis   . Polymyalgia rheumatica ([place])   . Pulmonary embolus ([place]) 2007   status post Coumadin  . PVC (premature ventricular contraction)   . Rotator cuff tendinitis    Social History   Social History  . Marital status: Divorced    Spouse name: N/A  . Number of children: N/A  . Years of education: N/A   Social History Main Topics  . Smoking status: Never Smoker  . Smokeless tobacco: Not on file  . Alcohol use No  . Drug use: Unknown  . Sexual activity: Not on file   Other Topics Concern  . Not on file   Social History Narrative  . No narrative on file   Family History  Problem Relation Age of Onset  . Emphysema Brother    Scheduled Meds: . sodium chloride   Intravenous Once  . aspirin  81 mg Oral Daily  . cefTRIAXone (ROCEPHIN)  IV  1 g Intravenous Q24H  . collagenase   Topical Daily  . feeding supplement (ENSURE ENLIVE)  237 mL Oral BID BM  . lisinopril  5 mg Oral Daily   Continuous Infusions: . sodium chloride Stopped (09/23/15 1200)   PRN Meds:.acetaminophen **OR** acetaminophen, LORazepam, magnesium hydroxide, morphine injection, ondansetron **OR** ondansetron (ZOFRAN) IV, senna-docusate Medications Prior to Admission:  Prior to Admission medications   Medication Sig Start Date End Date Taking? Authorizing Provider  aspirin 81 MG chewable tablet Chew 1 tablet  (81 mg total) by mouth daily. 01/09/15   Nishant Dhungel, MD  Calcium Carbonate-Vitamin D (CALCIUM 600+D) 600-400 MG-UNIT per tablet Take 2 tablets by mouth daily.     Historical Provider, MD  cholecalciferol (VITAMIN D) 1000 UNITS tablet Take 2,000 Units by mouth daily.     Historical Provider, MD  feeding supplement, ENSURE ENLIVE, (ENSURE ENLIVE) LIQD Take 237 mLs by mouth 2 (two) times daily between meals. 01/09/15   Nishant  Dhungel, MD  furosemide (LASIX) 40 MG tablet Take 1 tablet (40 mg total) by mouth daily. 01/09/15   Nishant Dhungel, MD  HYDROcodone-acetaminophen (NORCO/VICODIN) 5-325 MG per tablet Take 1 tablet by mouth every 6 (six) hours as needed for moderate pain.    Historical Provider, MD  lisinopril (PRINIVIL,ZESTRIL) 5 MG tablet Take 1 tablet (5 mg total) by mouth daily. 01/09/15   Nishant Dhungel, MD  LORazepam (ATIVAN) 1 MG tablet Take 0.5 mg by mouth 2 (two) times daily as needed for anxiety.     Historical Provider, MD  magnesium hydroxide (MILK OF MAGNESIA) 400 MG/5ML suspension Take 30 mLs by mouth daily as needed for mild constipation.    Historical Provider, MD  meclizine (ANTIVERT) 12.5 MG tablet Take 12.5 mg by mouth as needed for dizziness.    Historical Provider, MD  Multiple Vitamin (MULTIVITAMIN) tablet Take 1 tablet by mouth daily.    Historical Provider, MD  nitroGLYCERIN (NITROSTAT) 0.4 MG SL tablet Place 1 tablet (0.4 mg total) under the tongue every 5 (five) minutes as needed for chest pain. 01/09/15   Nishant Dhungel, MD  sennosides-docusate sodium (SENOKOT-S) 8.6-50 MG tablet Take 1 tablet by mouth daily as needed for constipation.    Historical Provider, MD   Allergies  Allergen Reactions  . Ultracet [Tramadol-Acetaminophen] Other (See Comments)    confusion  . Penicillins Rash   Review of Systems  Unable to perform ROS: Dementia    Physical Exam  Constitutional: She appears well-developed.  HENT:  Neck bent over to left shoulder chronically    Cardiovascular: Normal rate and regular rhythm.   Pulmonary/Chest: Effort normal. No accessory muscle usage. No tachypnea. No respiratory distress.  Abdominal: Soft. Normal appearance.  Neurological: She is alert. She is disoriented.    Vital Signs: BP 137/85   Pulse 73   Temp 97.5 F (36.4 C) (Oral)   Resp (!) 23   Ht 5' 7"  (1.702 m)   Wt 53.1 kg (117 lb)   SpO2 100%   BMI 18.32 kg/m  Pain Assessment: No/denies pain       SpO2: SpO2: 100 % O2 Device:SpO2: 100 % O2 Flow Rate: .O2 Flow Rate (L/min): 2 L/min  IO: Intake/output summary:  Intake/Output Summary (Last 24 hours) at 09/23/15 1340 Last data filed at 09/23/15 1300  Gross per 24 hour  Intake             2245 ml  Output                0 ml  Net             2245 ml    LBM:   Baseline Weight: Weight: 53.1 kg (117 lb) Most recent weight: Weight: 53.1 kg (117 lb)     Palliative Assessment/Data:   Flowsheet Rows   Flowsheet Row Most Recent Value  Intake Tab  Referral Department  Hospitalist  Unit at Time of Referral  Med/Surg Unit  Palliative Care Primary Diagnosis  Cardiac  Date Notified  09/22/15  Palliative Care Type  New Palliative care  Reason for referral  Clarify Goals of Care  Date of Admission  09/21/15  # of days IP prior to Palliative referral  1  Clinical Assessment  Psychosocial & Spiritual Assessment  Palliative Care Outcomes      Time In: 1330 Time Out: 1450 Time Total: 48mn Greater than 50%  of this time was spent counseling and coordinating care related to the above assessment and plan.  Signed by: Pershing Proud, NP   Please contact Palliative Medicine Team phone at [phone] for questions and concerns.  For individual provider: See Shea Evans

## 2015-09-24 ENCOUNTER — Inpatient Hospital Stay (HOSPITAL_COMMUNITY): Payer: Medicare Other

## 2015-09-24 DIAGNOSIS — F0390 Unspecified dementia without behavioral disturbance: Secondary | ICD-10-CM

## 2015-09-24 DIAGNOSIS — I350 Nonrheumatic aortic (valve) stenosis: Secondary | ICD-10-CM

## 2015-09-24 LAB — TYPE AND SCREEN
ABO/RH(D): A NEG
ANTIBODY SCREEN: NEGATIVE
UNIT DIVISION: 0
Unit division: 0

## 2015-09-24 LAB — BASIC METABOLIC PANEL
Anion gap: 7 (ref 5–15)
BUN: 13 mg/dL (ref 6–20)
CO2: 26 mmol/L (ref 22–32)
Calcium: 8 mg/dL — ABNORMAL LOW (ref 8.9–10.3)
Chloride: 106 mmol/L (ref 101–111)
Creatinine, Ser: 0.56 mg/dL (ref 0.44–1.00)
GFR calc Af Amer: >60 mL/min (ref >60)
GLUCOSE: 124 mg/dL — AB (ref 65–99)
POTASSIUM: 3.5 mmol/L (ref 3.5–5.1)
Sodium: 139 mmol/L (ref 135–145)

## 2015-09-24 LAB — ECHOCARDIOGRAM COMPLETE
Height: 67 in
Weight: 1872 oz

## 2015-09-24 LAB — CBC
HEMATOCRIT: 32.9 % — AB (ref 36.0–46.0)
HEMOGLOBIN: 10.5 g/dL — AB (ref 12.0–15.0)
MCH: 31.3 pg (ref 26.0–34.0)
MCHC: 31.9 g/dL (ref 30.0–36.0)
MCV: 98.2 fL (ref 78.0–100.0)
Platelets: 148 10*3/uL — ABNORMAL LOW (ref 150–400)
RBC: 3.35 MIL/uL — ABNORMAL LOW (ref 3.87–5.11)
RDW: 17.6 % — ABNORMAL HIGH (ref 11.5–15.5)
WBC: 6.9 10*3/uL (ref 4.0–10.5)

## 2015-09-24 MED ORDER — RESOURCE THICKENUP CLEAR PO POWD
ORAL | Status: DC | PRN
  Filled 2015-09-24: qty 125

## 2015-09-24 NOTE — Progress Notes (Signed)
Nutrition Follow-up / Consult  DOCUMENTATION CODES:   Underweight  INTERVENTION:    Magic cup TID with meals, each supplement provides 290 kcal and 9 grams of protein  NUTRITION DIAGNOSIS:   Increased nutrient needs related to wound healing as evidenced by estimated needs.  Ongoing  GOAL:   Patient will meet greater than or equal to 90% of their needs  Unmet  MONITOR:   PO intake, Supplement acceptance, Labs, Weight trends, Skin  REASON FOR ASSESSMENT:   Consult Poor PO  ASSESSMENT:   82 y.o. female with medical history significant of HTN, aortic stenosis, PE, PMR, h/o frequent falls presenting to the ER after grandson called 911 for concern of SOB. The grandson was present in the ER for a very short period of time and reported that patient has "fallen out of bed in the past 2-3 days" but otherwise has not fallen since June.  The patient was found to be quite disheveled with diffuse bruising and extensive wounds to her back.  Her left shoulder was found to be dislocated.  RN reports that patient was not given breakfast this morning because RN suspected she was aspirating. SLP evaluation completed this morning. Diet downgraded to Dysphagia 2 with honey thick liquids. Yesterday, patient consumed 25-75% of meals. Will add Magic Cup with meals to maximize oral intake of calories and protein.  Diet Order:  DIET DYS 2 Room service appropriate? Yes; Fluid consistency: Honey Thick  Skin:  Wound (see comment) (unstageable pressure injuries to sacrum & buttocks)  Last BM:  unknown  Height:   Ht Readings from Last 1 Encounters:  09/21/15 5\' 7"  (1.702 m)    Weight:   Wt Readings from Last 1 Encounters:  09/21/15 117 lb (53.1 kg)    Ideal Body Weight:  61.36 kg  BMI:  Body mass index is 18.32 kg/m.  Estimated Nutritional Needs:   Kcal:  1500-1700  Protein:  75-85 grams  Fluid:  1.5 L/day  EDUCATION NEEDS:   No education needs identified at this  time  Joaquin Courts, RD, LDN, CNSC Pager [phone] After Hours Pager [phone]

## 2015-09-24 NOTE — Care Management Important Message (Signed)
Important Message  Patient Details  Name: [name] MRN: [id number] Date of Birth: 1924-01-14   Medicare Important Message Given:  Yes    Shonya Sumida Abena 09/24/2015, 10:43 AM

## 2015-09-24 NOTE — Care Management Note (Signed)
Case Management Note  Patient Details  Name: Natalie Arnold MRN: [id number] Date of Birth: 01/22/1924  Subjective/Objective:    Pt lives with grandson, Aaron Edelman, and his girlfriend, has walker, w/c, BSC, and shower chair.  CM met with Ailene Ards with [facility], received release of information document and provided documentation as requested.  CM also talked with pt who stated she wanted to go home.  Per palliative care NP, pt indicated she felt safe @ home, felt home with hospice was preferred option for discharge.                                Expected Discharge Plan:  [place]  Discharge planning Services  CM Consult  Status of Service:  In process, will continue to follow  Girard Cooter, RN 09/24/2015, 1:30 PM

## 2015-09-24 NOTE — Progress Notes (Signed)
Echocardiogram 2D Echocardiogram has been performed.  Natalie Arnold 09/24/2015, 4:34 PM
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
## 2015-09-24 NOTE — Evaluation (Signed)
Clinical/Bedside Swallow Evaluation Patient Details  Name: Natalie Arnold MRN: [id number] Date of Birth: 10-28-1923  Today's Date: 09/24/2015 Time: SLP Start Time (ACUTE ONLY): 6237 SLP Stop Time (ACUTE ONLY): 0955 SLP Time Calculation (min) (ACUTE ONLY): 18 min  Past Medical History:  Past Medical History:  Diagnosis Date  . Anxiety   . Aortic stenosis, mild last echo 08/2009   pt declines further echoes 08/2010  . Carotid artery occlusion    carotid bruits no ICA stenosis  . History of shingles 2012  . Hypertension   . Hyponatremia 05/2009   Mild  . Osteoarthritis of left knee   . Osteoporosis   . Polymyalgia rheumatica (HCC)   . Pulmonary embolus (HCC) 2007   status post Coumadin  . PVC (premature ventricular contraction)   . Rotator cuff tendinitis    Past Surgical History:  Past Surgical History:  Procedure Laterality Date  . abdonimal hysterectomy    . SHOULDER CLOSED REDUCTION Left 09/22/2015   Procedure: CLOSED REDUCTION SHOULDER;  Surgeon: Tarry Kos, MD;  Location: [facility] OR;  Service: Orthopedics;  Laterality: Left;  . SKIN CANCER EXCISION     HPI:  82 y.o.femalewith past medical history of HTN, aortic stenosis, PE, h/o frequent falls, polymyalgia rheumatica, osteoporosisadmitted on 8/1/2017with SOB. Found to have left anterior shoulder dislocation. Per palliative care note, family wishes are for comfort care with hospice.   Assessment / Plan / Recommendation Clinical Impression  Pt holds her head in extension at rest, with second pillow added behind her head facilitating posture closer to an upright position. SLP provided verbal/tactile cueing for smaller sip sizes, still with multiple, audible swallows after sips of liquids. Immediate coughing elicited with thin liquid boluses which is not heard with nectar thick liquids, but these still result in up to 8 subswallows per bolus. Only 1-2 swallows are seen with even larger sips of honey thick liquids. Mod-Max cues  provided for mastication and awareness of pocketing food from soft solid tray, ultimately requiring manual removal of small amount of residual.   Pt is at a high risk for aspiration due to positioning, mentation, and signs of dysphagia as described above. Given pursuit of comfort, would recommend Dys 2 diet and honey thick liquids if family is agreeable to accept the risks. Will f/u for education and further modifications as needed.    Aspiration Risk  Severe aspiration risk    Diet Recommendation Dysphagia 2 (Fine chop);Honey-thick liquid   Liquid Administration via: Straw Medication Administration: Crushed with puree Supervision: Staff to assist with self feeding;Full supervision/cueing for compensatory strategies Compensations: Minimize environmental distractions;Slow rate;Small sips/bites;Lingual sweep for clearance of pocketing;Follow solids with liquid Postural Changes: Seated upright at 90 degrees;Remain upright for at least 30 minutes after po intake    Other  Recommendations Oral Care Recommendations: Oral care BID;Oral care before and after PO Other Recommendations: Order thickener from pharmacy;Prohibited food (jello, ice cream, thin soups);Remove water pitcher;Have oral suction available   Follow up Recommendations  24 hour supervision/assistance;Home health SLP    Frequency and Duration min 2x/week  2 weeks       Prognosis Prognosis for Safe Diet Advancement: Guarded Barriers to Reach Goals: Cognitive deficits;Severity of deficits      Swallow Study   General HPI: 82 y.o.femalewith past medical history of HTN, aortic stenosis, PE, h/o frequent falls, polymyalgia rheumatica, osteoporosisadmitted on 8/1/2017with SOB. Found to have left anterior shoulder dislocation. Per palliative care note, family wishes are for comfort care with hospice. Type  of Study: Bedside Swallow Evaluation Previous Swallow Assessment: none in chart Diet Prior to this Study: Dysphagia 3  (soft);Thin liquids Temperature Spikes Noted: No Respiratory Status: Nasal cannula History of Recent Intubation: No Behavior/Cognition: Alert;Cooperative;Pleasant mood;Confused;Requires cueing Oral Cavity Assessment: Dry Oral Care Completed by SLP: No Oral Cavity - Dentition: Poor condition;Missing dentition Self-Feeding Abilities: Needs assist Patient Positioning: Upright in bed;Other (comment) (head in extension at rest, alleviated with second pillow) Baseline Vocal Quality: Normal    Oral/Motor/Sensory Function Overall Oral Motor/Sensory Function: Within functional limits   Ice Chips Ice chips: Not tested   Thin Liquid Thin Liquid: Impaired Presentation: Straw Pharyngeal  Phase Impairments: Suspected delayed Swallow;Multiple swallows;Cough - Immediate;Other (comments) (audible swallows)    Nectar Thick Nectar Thick Liquid: Impaired Presentation: Straw Pharyngeal Phase Impairments: Suspected delayed Swallow;Multiple swallows;Other (comments) (audible swallows)   Honey Thick Honey Thick Liquid: Impaired Presentation: Straw Pharyngeal Phase Impairments: Suspected delayed Swallow;Other (comments);Multiple swallows (audible swallows)   Puree Puree: Not tested   Solid   GO   Solid: Impaired Presentation: Spoon Oral Phase Impairments: Impaired mastication;Poor awareness of bolus Oral Phase Functional Implications: Right lateral sulci pocketing;Impaired mastication Pharyngeal Phase Impairments: Suspected delayed Norva Riffle 09/24/2015,10:09 AM  Maxcine Ham, M.A. CCC-SLP [phone]

## 2015-09-24 NOTE — Consult Note (Addendum)
WOC consult requested for multiple wounds from home.  This was already performed on 8/2; refer to previous progress notes by Helmut Muster, [facility],   for assessment, measurements, and topical plan of care instructions have been provided for the bedside nurses. Please re-consult if further assistance is needed.  Thank-you,  Cammie Mcgee MSN, RN, CWOCN, [place], CNS [phone]

## 2015-09-25 DIAGNOSIS — W19XXXA Unspecified fall, initial encounter: Secondary | ICD-10-CM

## 2015-09-25 DIAGNOSIS — Y92099 Unspecified place in other non-institutional residence as the place of occurrence of the external cause: Secondary | ICD-10-CM

## 2015-09-25 LAB — CBC
HEMATOCRIT: 31.5 % — AB (ref 36.0–46.0)
HEMOGLOBIN: 10 g/dL — AB (ref 12.0–15.0)
MCH: 31.2 pg (ref 26.0–34.0)
MCHC: 31.7 g/dL (ref 30.0–36.0)
MCV: 98.1 fL (ref 78.0–100.0)
Platelets: 137 10*3/uL — ABNORMAL LOW (ref 150–400)
RBC: 3.21 MIL/uL — AB (ref 3.87–5.11)
RDW: 17.7 % — ABNORMAL HIGH (ref 11.5–15.5)
WBC: 6.9 10*3/uL (ref 4.0–10.5)

## 2015-09-25 LAB — BASIC METABOLIC PANEL
ANION GAP: 6 (ref 5–15)
BUN: 10 mg/dL (ref 6–20)
CHLORIDE: 108 mmol/L (ref 101–111)
CO2: 24 mmol/L (ref 22–32)
Calcium: 7.9 mg/dL — ABNORMAL LOW (ref 8.9–10.3)
Creatinine, Ser: 0.48 mg/dL (ref 0.44–1.00)
GFR calc non Af Amer: >60 mL/min (ref >60)
GLUCOSE: 94 mg/dL (ref 65–99)
POTASSIUM: 3.6 mmol/L (ref 3.5–5.1)
Sodium: 138 mmol/L (ref 135–145)
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
## 2015-09-25 NOTE — Progress Notes (Signed)
Speech Language Pathology Treatment: Dysphagia  Patient Details Name: Natalie Arnold MRN: [id number] DOB: December 09, 1923 Today's Date: 09/25/2015 Time: 3212-2482 SLP Time Calculation (min) (ACUTE ONLY): 24 min  Assessment / Plan / Recommendation Clinical Impression  Skilled observation of intake of Dysphagia 2 (chopped)/honey-thickened liquids via spoon/cup edge with minimal verbal/visual cueing needed for slow rate, small sips/bites and multiple swallows; recommendations for alternating solids/liquids as well during intake of current diet; no overt s/s of aspiration noted, but HOB as high as tolerated for pain tolerance with hyper-extended neck posture noted to increase multiple swallows initially, but elevating bed eliminated audible swallow and multiple swallows; pt indicated she had "thickened liquids at home." ST con't to monitor while pt is in house for diet tolerance.   HPI HPI: 82 y.o.femalewith past medical history of HTN, aortic stenosis, PE, h/o frequent falls, polymyalgia rheumatica, osteoporosisadmitted on 8/1/2017with SOB. Found to have left anterior shoulder dislocation. Per palliative care note, family wishes are for comfort care with hospice.      SLP Plan  Continue with current plan of care     Recommendations  Diet recommendations: Dysphagia 2 (fine chop);Honey-thick liquid Liquids provided via: Cup;No straw Medication Administration: Crushed with puree Supervision: Staff to assist with self feeding;Trained caregiver to feed patient Compensations: Minimize environmental distractions;Slow rate;Small sips/bites Postural Changes and/or Swallow Maneuvers: Seated upright 90 degrees             Oral Care Recommendations: Oral care BID;Oral care before and after PO Follow up Recommendations: 24 hour supervision/assistance;Home health SLP Plan: Continue with current plan of care                     Awesome Jared,PAT, M.S., CCC-SLP 09/25/2015, 12:11 PM

## 2015-09-25 NOTE — Progress Notes (Signed)
Received report at 11PM that patient was not on telemetry due to no telemetry order. RN rechecked patient's orders and there was an order for telemetry. RN paged Guenther,NP about no telemetry boxes being available even though there is an order for telemetry. Guenther,NP returned page and discontinued telemetry order.

## 2015-09-25 NOTE — Progress Notes (Signed)
RN called- tele ordered at time of admission this am but order just discovered. No tele boxes available on floor so need to d/c tele or transfer patient.   Recc: resting comfortably. Normal HR. K+ normal this am. Will discontinue tele order. AM rounding team can reassess tomorrow

## 2015-09-26 ENCOUNTER — Encounter (HOSPITAL_COMMUNITY): Payer: Self-pay
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
## 2015-09-27 MED ORDER — RESOURCE THICKENUP CLEAR PO POWD: ORAL | 6 refills | Status: AC

## 2015-09-27 MED ORDER — COLLAGENASE 250 UNIT/GM EX OINT: TOPICAL_OINTMENT | Freq: Every day | CUTANEOUS | 5 refills | Status: AC

## 2015-09-27 MED ORDER — LORAZEPAM 1 MG PO TABS: 0.5000 mg | ORAL_TABLET | Freq: Two times a day (BID) | ORAL | 0 refills | Status: AC | PRN

## 2015-09-27 NOTE — Progress Notes (Signed)
Pt discharged to home hospice. Natalie MausGrandson [name] called several times with no answer to give an update on patient. IV discontinued before discharge.

## 2015-09-27 NOTE — Progress Notes (Signed)
[facility] [phone]. CM confirmed pt's address with grandson [name] (c) and [phone]( [phone])  called by CM for pt's transportation to home. Gae Gallopngela Talen Poser RN,BSN,CM [phone]

## 2015-09-27 NOTE — Progress Notes (Addendum)
CM received call from Forrestine Himva Davis, [facility] ( [phone]) and confirmed home hospice referral and will see pt this am. Gae GallopAngela Adonai Selsor RN,BSN,CM

## 2015-09-27 NOTE — Progress Notes (Addendum)
CM received consult : Please arrange  HOSPICE at home, home RN for wound care.  CM spoke with grandson Arlys JohnBrian  regarding home hospice and offered choice. [facility]lys JohnBrian [facility] and stated pt will need hospital bed. CM made referral to [facility](referral center) / Dois DavenportSandra [phone]. Gae Gallopngela Shirly Bartosiewicz RN,BSN, [place] [phone]

## 2015-09-27 NOTE — Progress Notes (Signed)
[facility]  [facility] with request for discharge to home today. Spoke with [facility]/grandson Arlys JohnBrian by phone to confirm interest, explain services and answer questions. Clinical information being reviewed by hospice physician for eligibility. Hospital bed with over bed table ordered from [facility] to be delivered to home today. Patient already has home oxygen through [facility]. [facility] RN to visit patient at home tomorrow. RNCM Angela aware and to coordinate transport home via [facility] once delivery of hospital bed is confirmed by Brain.    Upon discharge:  Please send signed and completed out of facility DNR with patient.  Please send scripts for comfort medications.  Please fax discharge instructions to [phone]. Please contact [facility] when patient leaves the unit at [phone].  Thank you for this referral and assistance getting it set-up from PMT, RNCM and bedside RN.  Forrestine Himva Carlina Derks, LCSW [phone]

## 2015-09-27 NOTE — Care Management Important Message (Signed)
Important Message  Patient Details  Name: Natalie CotaDorothy B Arnold MRN: [id number] Date of Birth: 07/27/1923   Medicare Important Message Given:  Yes    [name] Abena 09/27/2015, 11:26 AM
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
## 2015-12-22 DEATH — deceased

## 2016-10-27 IMAGING — CT CT SHOULDER*L* W/O CM
3 series · 16 of 33 positions shown, 19 images · non-contrast
Comparison: Radiographs earlier this day

CLINICAL DATA: Left shoulder dislocation.

EXAM:
CT OF THE LEFT SHOULDER WITHOUT CONTRAST
TECHNIQUE: Multidetector CT imaging was performed according to the standard
protocol. Multiplanar CT image reconstructions were also generated.

[Series 4: shoulder 5.0 b31s · axial · 0.34mm/px · z∈[+532,+642]mm · 8 of 28 slices shown, 10 images]
[im 3/28  soft-tissue]
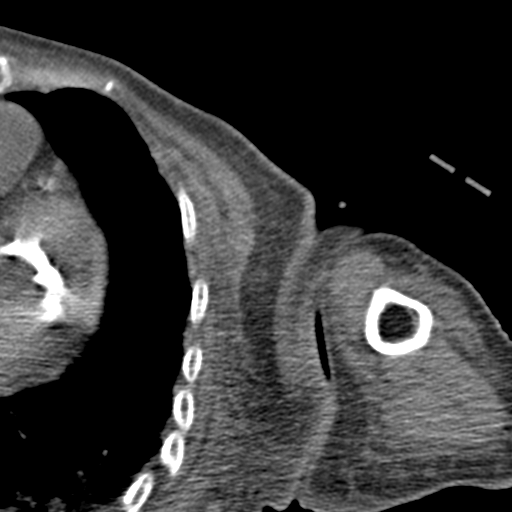
[im 3/28  bone]
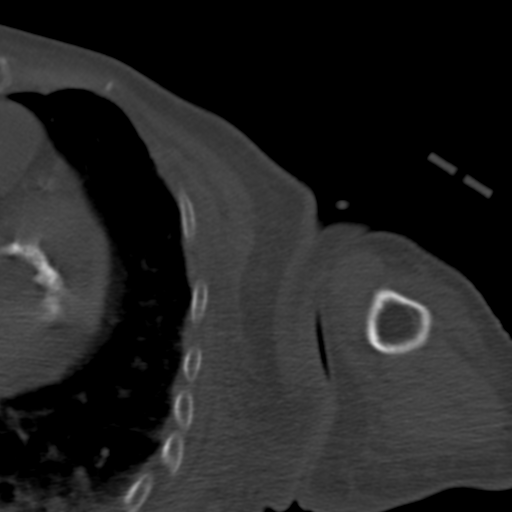
[im 7/28  bone]
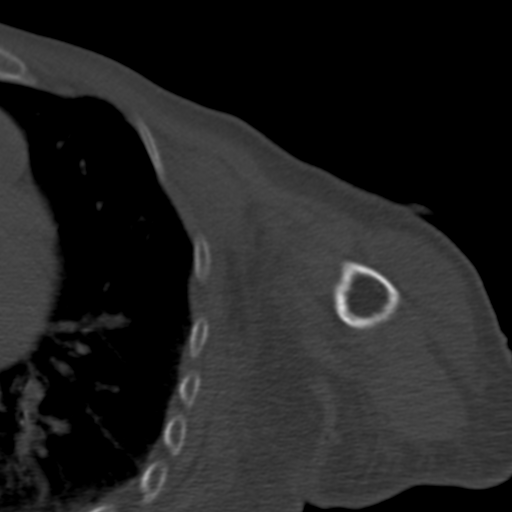
[im 9/28  bone]
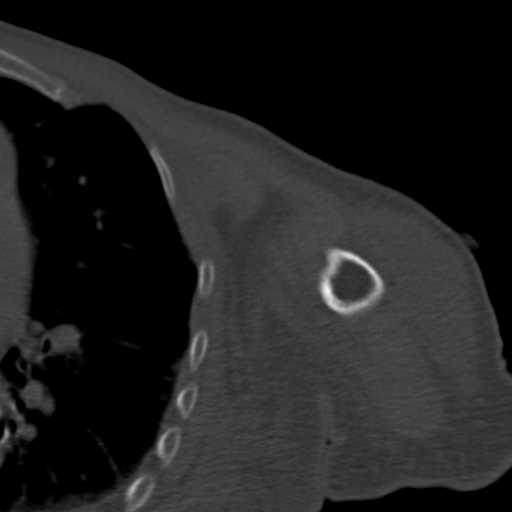
[im 13/28  bone]
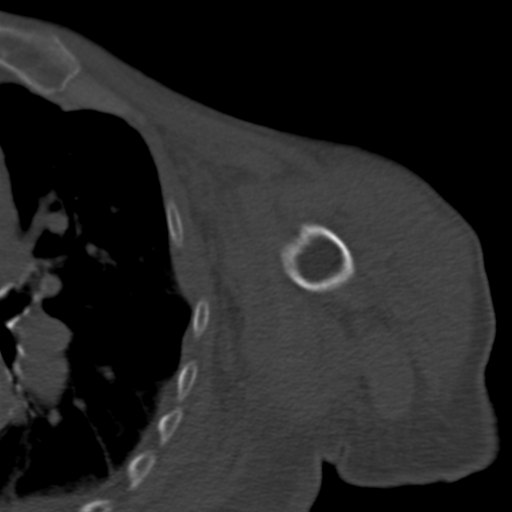
[im 15/28  soft-tissue]
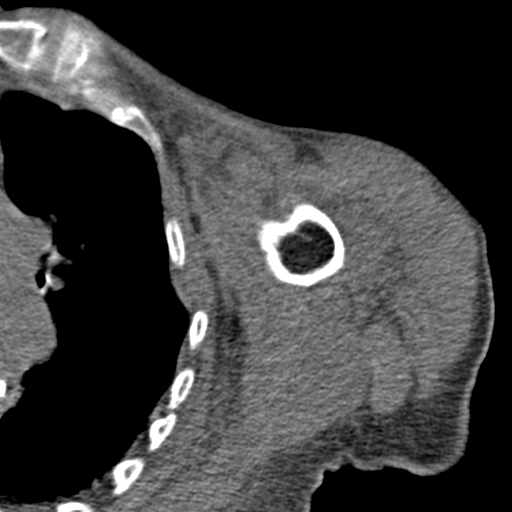
[im 15/28  bone]
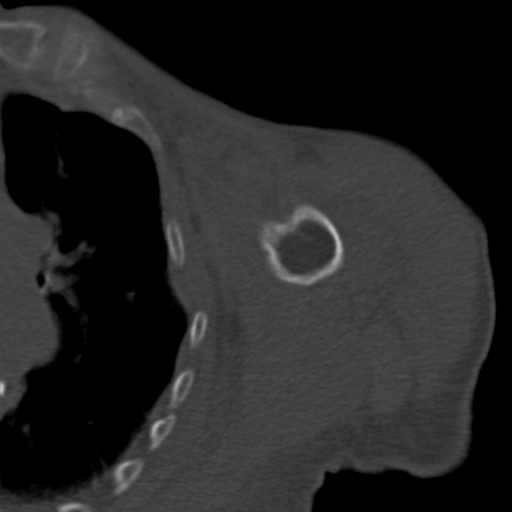
[im 19/28  bone]
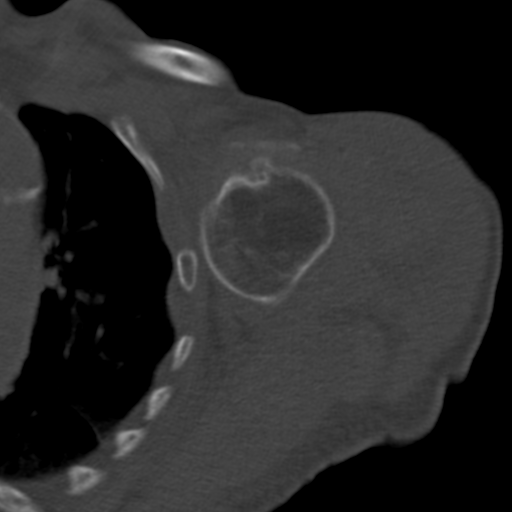
[im 21/28  bone]
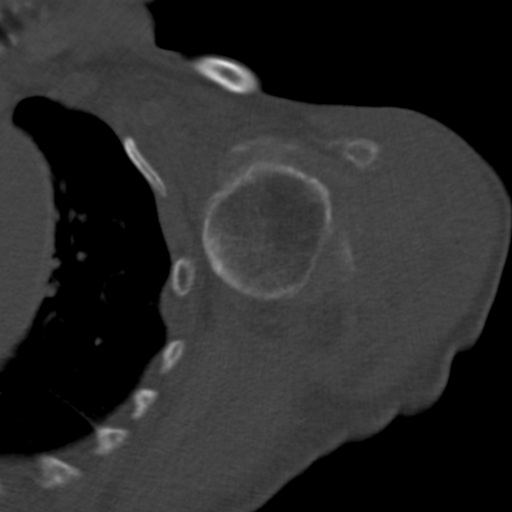
[im 25/28  bone]
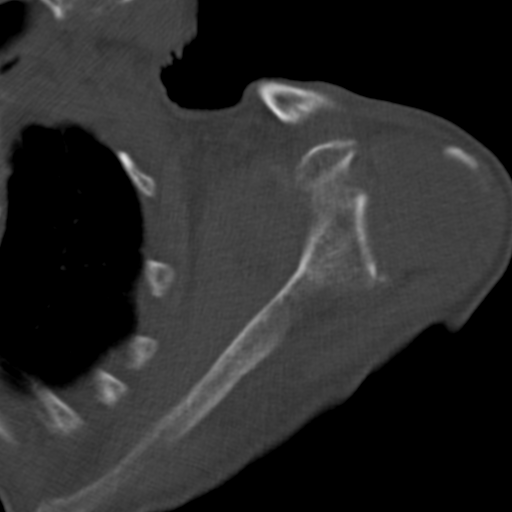

[Series 602: <mpr thick range> · coronal · 0.34mm/px · 3 of 53 slices shown]
[im 11/53  bone]
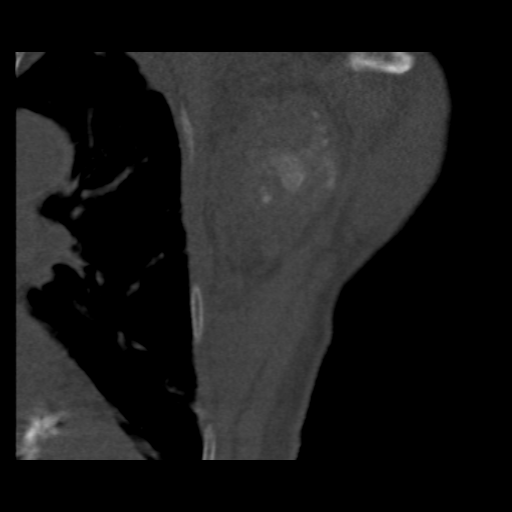
[im 21/53  bone]
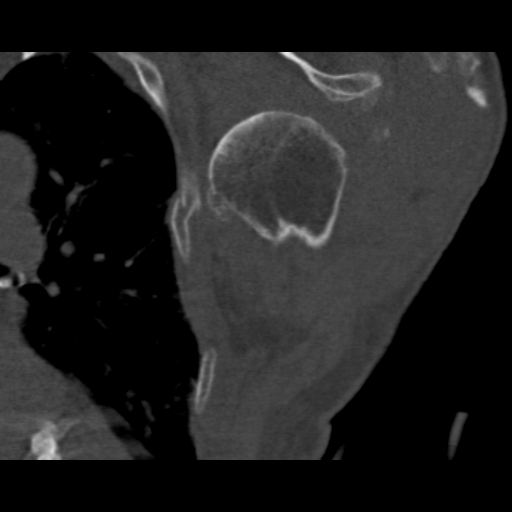
[im 32/53  bone]
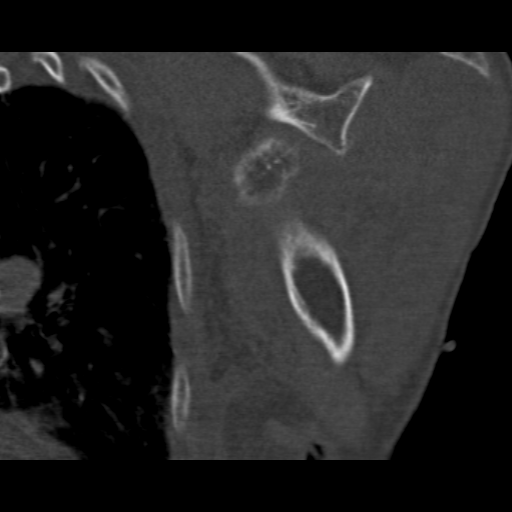

[Series 603: <mpr thick range(1)> · sagittal · 0.34mm/px · 5 of 82 slices shown, 6 images]
[im 28/82  bone]
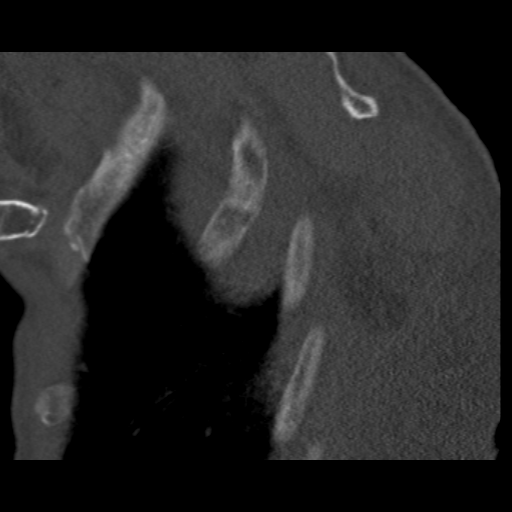
[im 34/82  bone]
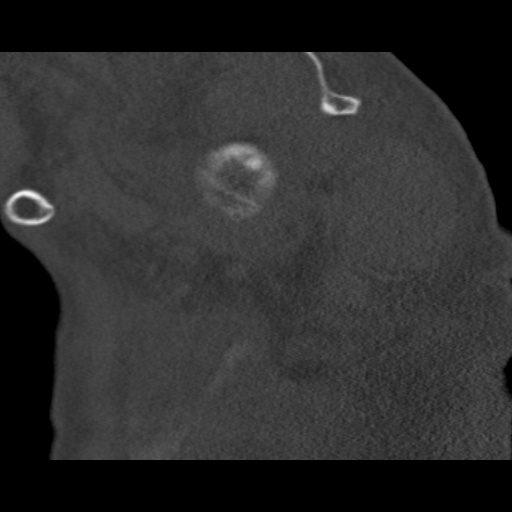
[im 41/82  soft-tissue]
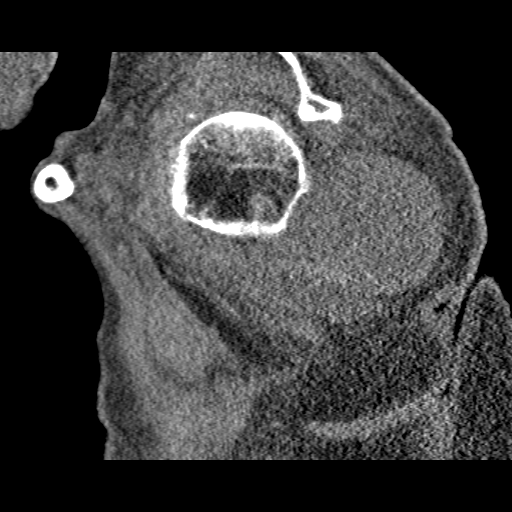
[im 41/82  bone]
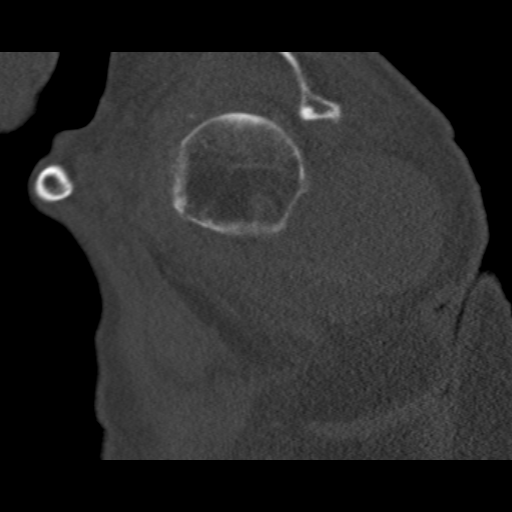
[im 48/82  bone]
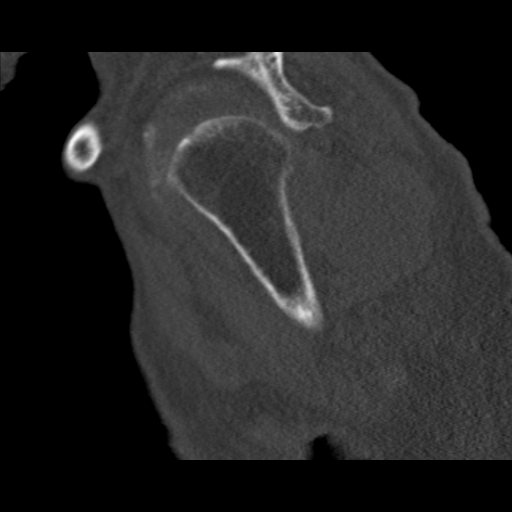
[im 55/82  bone]
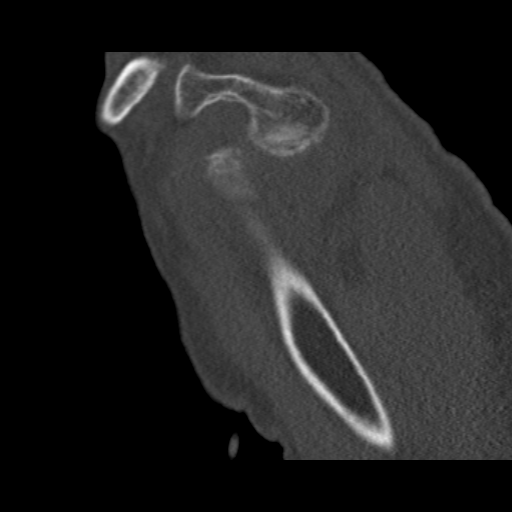

[16 of 33 positions shown; findings below may reference images not displayed]

FINDINGS: Anterior shoulder dislocation, the left humeral head is dislocated
anterior inferiorly. Posterior aspect of the humeral head abuts the
anterior surface of the lower glenoid/scapula. Probable curvilinear
fracture of the humeral head involving the lateral cortex with
minimal lateral displacement. Small adjacent osseous density may
reflect an additional fracture fragment. Acromioclavicular joint is
only partially included in the field of view but remains congruent.
There is an age-indeterminate fracture of the left anterior lateral
second rib.
IMPRESSION: Anterior shoulder dislocation with humeral head lodged anterior to
the inferior glenoid/anterior scapula. There is a probable
curvilinear fracture of the lateral humeral head. Tiny adjacent
osseous densities likely a small fracture fragment.

Age indeterminate left anterior lateral sec rib fracture per
